# Patient Record
Sex: Male | Born: 1975 | Race: White | Hispanic: No | Marital: Married | Smoking: Never smoker
Health system: Southern US, Community
[De-identification: ages and names within clinical notes are randomized; demographics above are authoritative.]

## PROBLEM LIST (undated history)

## (undated) DIAGNOSIS — I10 Essential (primary) hypertension: Secondary | ICD-10-CM

## (undated) DIAGNOSIS — N400 Enlarged prostate without lower urinary tract symptoms: Secondary | ICD-10-CM

## (undated) DIAGNOSIS — R079 Chest pain, unspecified: Secondary | ICD-10-CM

## (undated) DIAGNOSIS — R000 Tachycardia, unspecified: Secondary | ICD-10-CM

## (undated) DIAGNOSIS — N419 Inflammatory disease of prostate, unspecified: Secondary | ICD-10-CM

## (undated) DIAGNOSIS — I4891 Unspecified atrial fibrillation: Secondary | ICD-10-CM

## (undated) DIAGNOSIS — I341 Nonrheumatic mitral (valve) prolapse: Secondary | ICD-10-CM

## (undated) HISTORY — DX: Chest pain, unspecified: R07.9

## (undated) HISTORY — DX: Tachycardia, unspecified: R00.0

## (undated) HISTORY — DX: Nonrheumatic mitral (valve) prolapse: I34.1

## (undated) HISTORY — DX: Inflammatory disease of prostate, unspecified: N41.9

## (undated) HISTORY — DX: Essential (primary) hypertension: I10

## (undated) HISTORY — PX: KNEE SURGERY: SHX244

---

## 1998-12-05 ENCOUNTER — Ambulatory Visit (HOSPITAL_BASED_OUTPATIENT_CLINIC_OR_DEPARTMENT_OTHER): Admission: RE | Admit: 1998-12-05 | Discharge: 1998-12-05 | Payer: Self-pay | Admitting: Orthopedic Surgery

## 2003-12-16 ENCOUNTER — Other Ambulatory Visit: Payer: Self-pay

## 2004-05-20 ENCOUNTER — Emergency Department: Payer: Self-pay | Admitting: Emergency Medicine

## 2008-03-29 ENCOUNTER — Emergency Department: Payer: Self-pay | Admitting: Emergency Medicine

## 2008-03-29 ENCOUNTER — Other Ambulatory Visit: Payer: Self-pay

## 2008-05-11 ENCOUNTER — Emergency Department: Payer: Self-pay | Admitting: Emergency Medicine

## 2008-05-13 ENCOUNTER — Emergency Department: Payer: Self-pay | Admitting: Internal Medicine

## 2012-03-16 ENCOUNTER — Emergency Department (INDEPENDENT_AMBULATORY_CARE_PROVIDER_SITE_OTHER): Payer: BC Managed Care – PPO

## 2012-03-16 ENCOUNTER — Encounter (HOSPITAL_COMMUNITY): Payer: Self-pay | Admitting: *Deleted

## 2012-03-16 ENCOUNTER — Emergency Department (INDEPENDENT_AMBULATORY_CARE_PROVIDER_SITE_OTHER)
Admission: EM | Admit: 2012-03-16 | Discharge: 2012-03-16 | Disposition: A | Discharge status: Home / Self Care | Payer: BC Managed Care – PPO | Source: Home / Self Care | Attending: Emergency Medicine | Admitting: Emergency Medicine

## 2012-03-16 DIAGNOSIS — R0789 Other chest pain: Secondary | ICD-10-CM

## 2012-03-16 DIAGNOSIS — R05 Cough: Secondary | ICD-10-CM

## 2012-03-16 MED ORDER — PREDNISONE 10 MG PO TABS: 20.0000 mg | ORAL_TABLET | Freq: Every day | ORAL | Status: DC

## 2012-03-16 MED ORDER — ACETAMINOPHEN-CODEINE #3 300-30 MG PO TABS: 1.0000 | ORAL_TABLET | Freq: Four times a day (QID) | ORAL | Status: DC | PRN

## 2012-03-16 NOTE — ED Notes (Signed)
Pt  Has  Symptoms  Of a  Cough  /  Congestion  With     sorethroat  And     A pretty  Much  Non [productive  Cough  Which  He reports  She  Has  Had  For  Several  Days  -     He  Is  Sitting  Upright  On the  Exam table  pseaking in  Complete  sentances  Is in no  Acute  Distress

## 2012-03-16 NOTE — ED Provider Notes (Signed)
History     CSN: [id number]  Arrival date & time 03/16/12  7829   First MD Initiated Contact with Patient 03/16/12 1008      Chief Complaint  Patient presents with  . URI    (Consider location/radiation/quality/duration/timing/severity/associated sxs/prior treatment) HPI Comments: Patient presents urgent care complaining of a cough, congestion and a sore throat with a dry cough. For about 3-5 days. Patient describes also that has this pressure sensation on the right side of his chest. And also some discomfort on the top of his left shoulder that is exacerbated by movement. Patient denies any fevers, shortness of breath or wheezing. Does describe that his wife has bronchitis and she was concerned that maybe he is having bronchitis as well. Patient is a nonsmoker and denies any other medical problems. Further questioning patient denies any constitutional symptoms such as fevers, mild as arthralgias headaches or changes in appetite.  Patient is a 36 y.o. male presenting with URI. The history is provided by the patient and the spouse.  URI The primary symptoms include cough. Primary symptoms do not include fever, fatigue, headaches, ear pain, wheezing, abdominal pain, nausea, vomiting, myalgias, arthralgias or rash. The current episode started 3 to 5 days ago. This is a new problem. The problem has not changed since onset. Symptoms associated with the illness include congestion and rhinorrhea. The illness is not associated with chills, plugged ear sensation or facial pain.    History reviewed. No pertinent past medical history.  History reviewed. No pertinent past surgical history.  History reviewed. No pertinent family history.  History  Substance Use Topics  . Smoking status: Not on file  . Smokeless tobacco: Not on file  . Alcohol Use: Not on file      Review of Systems  Constitutional: Negative for fever, chills, activity change and fatigue.  HENT: Positive for congestion and  rhinorrhea. Negative for ear pain, neck pain and neck stiffness.   Respiratory: Positive for cough. Negative for chest tightness, shortness of breath, wheezing and stridor.   Gastrointestinal: Negative for nausea, vomiting and abdominal pain.  Genitourinary: Negative for dysuria.  Musculoskeletal: Negative for myalgias and arthralgias.  Skin: Negative for color change and rash.  Neurological: Negative for dizziness, numbness and headaches.    Allergies  Review of patient's allergies indicates no known allergies.  Home Medications   Current Outpatient Rx  Name Route Sig Dispense Refill  . ACETAMINOPHEN-CODEINE #3 300-30 MG PO TABS Oral Take 1-2 tablets by mouth every 6 (six) hours as needed for pain. 15 tablet 0  . PREDNISONE 10 MG PO TABS Oral Take 2 tablets (20 mg total) by mouth daily. 15 tablet 0    BP 141/85  Pulse 60  Temp 98.5 F (36.9 C) (Oral)  Resp 18  SpO2 100%  Physical Exam  Nursing note and vitals reviewed. Constitutional: He appears well-nourished. No distress.  HENT:  Head: Normocephalic and atraumatic.  Eyes: Conjunctivae normal are normal.  Neck: Neck supple. No JVD present.  Cardiovascular: Normal rate and regular rhythm.  Exam reveals no gallop and no friction rub.   No murmur heard. Pulmonary/Chest: Effort normal and breath sounds normal. No respiratory distress. He has no decreased breath sounds. He has no wheezes. He has no rales. Chest wall is not dull to percussion. He exhibits tenderness. He exhibits no mass, no bony tenderness, no crepitus, no edema, no deformity, no swelling and no retraction.      ED Course  Procedures (including critical care time)  Labs Reviewed - No data to display Dg Chest 2 View  03/16/2012  *RADIOLOGY REPORT*  Clinical Data: Cough, congestion, fever.  CHEST - 2 VIEW  Comparison: None  Findings: Heart and mediastinal contours are within normal limits. No focal opacities or effusions.  No acute bony abnormality.   IMPRESSION: No active cardiopulmonary disease.   Original Report Authenticated By: Cyndie Chime, M.D.      1. Cough   2. Muscular chest pain      Handheld rhythm strip for 60 seconds with no observable abnormalities MDM  Patient with respiratory symptoms and anterior chest wall pain for several days. Patient was prescribed a symptomatic management course with prednisone and Tylenol No. 3 to help him with his muscular skeletal chest wall pain and is cough. Patient is afebrile no shortness of breath and uncertain if this is a infectious process at all. Both x-rays, EKG and exam were not suggestive of any cardiac origin or further lung disorder such as pneumonia or bronchitis or pneumothorax       Jimmie Molly, MD 03/16/12 1336

## 2012-08-28 ENCOUNTER — Encounter: Payer: Self-pay | Admitting: Family Medicine

## 2012-08-28 ENCOUNTER — Ambulatory Visit (INDEPENDENT_AMBULATORY_CARE_PROVIDER_SITE_OTHER): Payer: BC Managed Care – PPO | Admitting: Family Medicine

## 2012-08-28 VITALS — BP 138/92 | HR 64 | Temp 98.2°F | Ht 71.0 in | Wt 202.0 lb

## 2012-08-28 DIAGNOSIS — R413 Other amnesia: Secondary | ICD-10-CM

## 2012-08-28 DIAGNOSIS — Z136 Encounter for screening for cardiovascular disorders: Secondary | ICD-10-CM

## 2012-08-28 DIAGNOSIS — Z0000 Encounter for general adult medical examination without abnormal findings: Secondary | ICD-10-CM

## 2012-08-28 LAB — COMPREHENSIVE METABOLIC PANEL
AST: 34 U/L (ref 0–37)
Albumin: 4 g/dL (ref 3.5–5.2)
Alkaline Phosphatase: 72 U/L (ref 39–117)
Calcium: 9.3 mg/dL (ref 8.4–10.5)
Chloride: 104 mEq/L (ref 96–112)
Potassium: 3.6 mEq/L (ref 3.5–5.1)
Sodium: 138 mEq/L (ref 135–145)
Total Protein: 7.4 g/dL (ref 6.0–8.3)

## 2012-08-28 LAB — LIPID PANEL
LDL Cholesterol: 73 mg/dL (ref 0–99)
Total CHOL/HDL Ratio: 4

## 2012-08-28 NOTE — Progress Notes (Signed)
Subjective:    Patient ID: Chad Harrington, male    DOB: 08/02/1975, 37 y.o.   MRN: [id number]  HPI  Very pleasant 37 yo male here to establish care with his wife Chad Harrington, who is my patient.  Has not been to see a physician in over 10 or 12 years.  Does have strong FH of DM, COPD.  He is a non smoker.  His wife is concerned that at times he has tingling in his feet. He also seems to forgot little things she asks him to do. Never forgets names of people he knows or where is he is going.  Patient Active Problem List  Diagnosis  . Routine general medical examination at a health care facility   Past Medical History  Diagnosis Date  . Mitral valve prolapse    Past Surgical History  Procedure Laterality Date  . Knee surgery     History  Substance Use Topics  . Smoking status: Passive Smoke Exposure - Never Smoker  . Smokeless tobacco: Not on file  . Alcohol Use: Not on file   Family History  Problem Relation Age of Onset  . Diabetes Father   . COPD Father   . Hypertension Father   . Heart disease Father 57    MI  . Cancer Maternal Grandfather     colon CA   No Known Allergies No current outpatient prescriptions on file prior to visit.   No current facility-administered medications on file prior to visit.   The PMH, PSH, Social History, Family History, Medications, and allergies have been reviewed in [facility], and have been updated if relevant.    Review of Systems See HPI Patient reports no  vision/ hearing changes,anorexia, weight change, fever ,adenopathy, persistant / recurrent hoarseness, swallowing issues, chest pain, edema,persistant / recurrent cough, hemoptysis, dyspnea(rest, exertional, paroxysmal nocturnal), gastrointestinal  bleeding (melena, rectal bleeding), abdominal pain, excessive heart burn, GU symptoms(dysuria, hematuria, pyuria, voiding/incontinence  Issues) syncope, focal weakness, severe memory loss, concerning skin lesions, depression, anxiety,  abnormal bruising/bleeding, major joint swelling.       Objective:   Physical Exam BP 138/92  Pulse 64  Temp(Src) 98.2 F (36.8 C)  Ht 5\' 11"  (1.803 m)  Wt 202 lb (91.627 kg)  BMI 28.19 kg/m2 General:  overweght male in NAD Eyes:  PERRL Ears:  External ear exam shows no significant lesions or deformities.  Otoscopic examination reveals clear canals, tympanic membranes are intact bilaterally without bulging, retraction, inflammation or discharge. Hearing is grossly normal bilaterally. Nose:  External nasal examination shows no deformity or inflammation. Nasal mucosa are pink and moist without lesions or exudates. Mouth:  Oral mucosa and oropharynx without lesions or exudates.  Teeth in good repair. Neck:  no carotid bruit or thyromegaly no cervical or supraclavicular lymphadenopathy  Lungs:  Normal respiratory effort, chest expands symmetrically. Lungs are clear to auscultation, no crackles or wheezes. Heart:  Normal rate and regular rhythm. S1 and S2 normal without gallop, murmur, click, rub or other extra sounds. Abdomen:  Bowel sounds positive,abdomen soft and non-tender without masses, organomegaly or hernias noted. Pulses:  R and L posterior tibial pulses are full and equal bilaterally  Extremities:  no edema      Assessment & Plan:  1. Routine general medical examination at a health care facility .preventinon  - Comprehensive metabolic panel  2. Screening for ischemic heart disease  - Lipid Panel  3. Memory loss No signs on exam for something worrisome- non focal neuro exam.  Will check CMET, TSH and B12 to rule out other possible factors.  The patient indicates understanding of these issues and agrees with the plan.  - Vitamin B12 - TSH

## 2012-08-28 NOTE — Patient Instructions (Addendum)
Good to see you. We will call you with your lab results.   

## 2012-08-29 ENCOUNTER — Encounter: Payer: Self-pay | Admitting: *Deleted

## 2012-08-29 LAB — TSH: TSH: 1.23 u[IU]/mL (ref 0.35–5.50)

## 2012-08-29 LAB — VITAMIN B12: Vitamin B-12: 219 pg/mL (ref 211–911)

## 2013-02-24 ENCOUNTER — Ambulatory Visit (INDEPENDENT_AMBULATORY_CARE_PROVIDER_SITE_OTHER): Payer: BC Managed Care – PPO | Admitting: Internal Medicine

## 2013-02-24 ENCOUNTER — Encounter: Payer: Self-pay | Admitting: Internal Medicine

## 2013-02-24 VITALS — BP 124/84 | HR 74 | Temp 98.4°F | Wt 197.0 lb

## 2013-02-24 DIAGNOSIS — Z8679 Personal history of other diseases of the circulatory system: Secondary | ICD-10-CM

## 2013-02-24 DIAGNOSIS — R209 Unspecified disturbances of skin sensation: Secondary | ICD-10-CM

## 2013-02-24 DIAGNOSIS — R202 Paresthesia of skin: Secondary | ICD-10-CM | POA: Insufficient documentation

## 2013-02-24 DIAGNOSIS — R0789 Other chest pain: Secondary | ICD-10-CM

## 2013-02-24 DIAGNOSIS — R200 Anesthesia of skin: Secondary | ICD-10-CM

## 2013-02-24 NOTE — Progress Notes (Signed)
Chief Complaint  Patient presents with  . right hand numbenss and tingling    chest discomfort     HPI: Patient comes in today for [facility] Saturday clinic for  new problem evaluation. 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 From shoulder . No injury went to work.  But developed what he called  Chest tightness without sob sweats or syncope of dizziness of fever.  He describe the sx like"Funny bone type sx"  And went to work and when arm felt  heavy and numb. He contacted his wife . Came here to saturday clinic cause of concerns   He has a hx of heart issues.   MVP  No cad .   Right  Arm and right handed.no injury active . No hx of same . uncertain if hand looks swollen   ROS: See pertinent positives and negatives per HPI. No cough sob sweating syncope NVD no bleeding hx  No leg sx no dysphagia   Past Medical History  Diagnosis Date  . Mitral valve prolapse     Family History  Problem Relation Age of Onset  . Diabetes Father   . COPD Father   . Hypertension Father   . Heart disease Father 8    MI  . Cancer Maternal Grandfather     colon CA    History   Social History  . Marital Status: Married    Spouse Name: N/A    Number of Children: N/A  . Years of Education: N/A   Social History Main Topics  . Smoking status: Passive Smoke Exposure - Never Smoker  . Smokeless tobacco: None  . Alcohol Use: None  . Drug Use: None  . Sexual Activity: None   Other Topics Concern  . None   Social History Narrative  . None    No outpatient encounter prescriptions on file as of 02/24/2013.   No facility-administered encounter medications on file as of 02/24/2013.    EXAM:  BP 124/84  Pulse 74  Temp(Src) 98.4 F (36.9 C) (Oral)  Wt 197 lb (89.359 kg)  BMI 27.49 kg/m2  Body mass index is 27.49 kg/(m^2).  GENERAL: vitals reviewed and listed above, alert,  oriented, appears well hydrated and in no acute distress HEENT: atraumatic, conjunctiva  clear, no obvious abnormalities on inspection of external nose and ears OP : no lesion edema or exudate   NECK: no obvious masses on inspection palpation  No adenopathy or jvd no bruit  LUNGS: clear to auscultation bilaterally, no wheezes, rales or rhonchi, good air movement CV: HRRR, no m heart today  no clubbing cyanosis or  peripheral edema nl cap refill  MS: moves all extremities right hand looks puffy no redness or warmth veins collapse at heart level and pulsed radial nl ? Ulnar. Nails beds normal .  NEURO: oriented x 3 CN 3-12 appear intact. No focal muscle weakness or atrophy. DTRs symmetrical. Gait WNL.  Grossly non focal. No tremor or abnormal movement. Grip nl ? Ulnar finger opposition may be less on right than left   No atrophy.  IOM Skin clear tatoos PSYCH: pleasant and cooperative, no obvious depression or anxiety EKG no acute changes ASSESSMENT AND PLAN:  Discussed the following assessment and plan:  Numbness and tingling of right arm - nl exam except puffy hand ? compression sx ulnar distributionvs other  Chest discomfort - no acute findings on ekg and no assc sx   - Plan: EKG 12-Lead  History of mitral valve prolapse Stable at this time  No work this week end observe and if recurring or alarm features seek ed care   Otherwise to PCP office after weekend -Patient advised to return or notify health care team  if symptoms worsen or persist or new concerns arise.  Patient Instructions   This doesn't seem like acute heart or vascular event at this time but I agree needs follow up with your doctor. The arm sx can certainly be related to pinched nerves either in neck or down arm based   Your.  hx and exam.   Fortunately  Heart sounds good and no signs of stroke .  No work  For this weekend .   See  Your doctor  on Monday .  If  worsening sx  Or any weakness or other difficulties then  contact on call service  Or other  Urgent care.     Neta Mends. Panosh M.D.

## 2013-02-24 NOTE — Patient Instructions (Signed)
   This doesn't seem like acute heart or vascular event at this time but I agree needs follow up with your doctor. The arm sx can certainly be related to pinched nerves either in neck or down arm based   Your.  hx and exam.   Fortunately  Heart sounds good and no signs of stroke .  No work  For this weekend .   See  Your doctor  on Monday .  If  worsening sx  Or any weakness or other difficulties then contact on call service  Or other  Urgent care.

## 2013-02-26 ENCOUNTER — Encounter: Payer: Self-pay | Admitting: Cardiovascular Disease

## 2013-02-26 ENCOUNTER — Ambulatory Visit (INDEPENDENT_AMBULATORY_CARE_PROVIDER_SITE_OTHER): Payer: BC Managed Care – PPO | Admitting: Cardiovascular Disease

## 2013-02-26 ENCOUNTER — Encounter: Payer: Self-pay | Admitting: Family Medicine

## 2013-02-26 ENCOUNTER — Ambulatory Visit (INDEPENDENT_AMBULATORY_CARE_PROVIDER_SITE_OTHER): Payer: BC Managed Care – PPO | Admitting: Family Medicine

## 2013-02-26 ENCOUNTER — Telehealth: Payer: Self-pay | Admitting: Family Medicine

## 2013-02-26 VITALS — BP 120/90 | HR 53 | Ht 71.0 in | Wt 197.8 lb

## 2013-02-26 VITALS — BP 120/82 | HR 62 | Temp 97.9°F | Wt 198.0 lb

## 2013-02-26 DIAGNOSIS — R200 Anesthesia of skin: Secondary | ICD-10-CM

## 2013-02-26 DIAGNOSIS — R0789 Other chest pain: Secondary | ICD-10-CM

## 2013-02-26 DIAGNOSIS — Z8679 Personal history of other diseases of the circulatory system: Secondary | ICD-10-CM

## 2013-02-26 DIAGNOSIS — R209 Unspecified disturbances of skin sensation: Secondary | ICD-10-CM

## 2013-02-26 DIAGNOSIS — R002 Palpitations: Secondary | ICD-10-CM

## 2013-02-26 DIAGNOSIS — R079 Chest pain, unspecified: Secondary | ICD-10-CM

## 2013-02-26 MED ORDER — OMEPRAZOLE 20 MG PO CPDR: 20.0000 mg | DELAYED_RELEASE_CAPSULE | Freq: Two times a day (BID) | ORAL | Status: DC

## 2013-02-26 MED ORDER — NITROGLYCERIN 0.4 MG SL SUBL: 0.4000 mg | SUBLINGUAL_TABLET | SUBLINGUAL | Status: DC | PRN

## 2013-02-26 NOTE — Progress Notes (Signed)
   HPI: Very pleasant 37 yo male who takes no medications regularly here for: Persistent chest discomfort.  Went to Saturday clinic this weekend for right arm tingling, swelling and chest discomfort.  EKG borderline, advised to follow up with me today.  Right arm tingling has improved and swelling is gone but continues to have chest discomfort.  Wife reports that he woke up 4 or 5 times last night with chest tightness.  He has a hx of heart issues.   MVP  No cad . Chest pain can occur with rest or exertion.  Has had some dizziness and diaphoresis with exertion which is improved with rest.  Dad had MI in his 40s.  Lab Results  Component Value Date   CHOL 138 08/28/2012   HDL 33.40* 08/28/2012   LDLCALC 73 08/28/2012   TRIG 159.0* 08/28/2012   CHOLHDL 4 08/28/2012      Past Medical History  Diagnosis Date  . Mitral valve prolapse     Family History  Problem Relation Age of Onset  . Diabetes Father   . COPD Father   . Hypertension Father   . Heart disease Father 67    MI  . Cancer Maternal Grandfather     colon CA    History   Social History  . Marital Status: Married    Spouse Name: N/A    Number of Children: N/A  . Years of Education: N/A   Social History Main Topics  . Smoking status: Passive Smoke Exposure - Never Smoker  . Smokeless tobacco: None  . Alcohol Use: None  . Drug Use: None  . Sexual Activity: None   Other Topics Concern  . None   Social History Narrative  . None    No outpatient encounter prescriptions on file as of 02/26/2013.   No facility-administered encounter medications on file as of 02/26/2013.    EXAM:  BP 120/82  Pulse 62  Temp(Src) 97.9 F (36.6 C)  Wt 198 lb (89.812 kg)  BMI 27.63 kg/m2  SpO2 98%  Body mass index is 27.63 kg/(m^2).  GENERAL: vitals reviewed and listed above, alert, oriented, appears well hydrated and in no acute distress HEENT: atraumatic, conjunctiva  clear, no obvious abnormalities on inspection of  external nose and ears OP : no lesion edema or exudate  NECK: no obvious masses on inspection palpation  No adenopathy or jvd no bruit  LUNGS: clear to auscultation bilaterally, no wheezes, rales or rhonchi, good air movement CV: HRRR, no m heart today  no clubbing cyanosis or  peripheral edema nl cap refill  MS: moves all extremities right hand looks puffy no redness or warmth veins collapse at heart level and pulsed radial nl ? Ulnar. Nails beds normal .  NEURO: oriented x 3 CN 3-12 appear intact. No focal muscle weakness or atrophy. DTRs symmetrical. Gait WNL.  Grossly non focal. No tremor or abnormal PSYCH: pleasant and cooperative, no obvious depression or anxiety   ASSESSMENT AND PLAN:  1. Chest discomfort Persistent, somewhat atypical but is having some exertional symptoms relieved by rest.  Does not evaluation by cardiologist- echo and stress test test.  Referral placed. EKG unchanged from Saturday.  Does have some bradycardia. The patient indicates understanding of these issues and agrees with the plan.  - Ambulatory referral to Cardiology - EKG 12-Lead

## 2013-02-26 NOTE — Progress Notes (Signed)
Patient ID: Chad Harrington, male    DOB: Nov 01, 1975, 37 y.o.   MRN: [id number]  HPI Comments: Mr. Heft is a very pleasant 37 year old gentleman history of chronic chest pain dating back to age 33 who presents for evaluation of chest pain symptoms.  He reports that he has had numerous trips to the emergency room, workup in the past. He has been treated with various medications including atenolol, Toprol with no improvement of his symptoms.  visits to the emergency room did not yield a reason for his chest pain. Felt to have GERD. Never tried proton pump inhibitors. Told to try H2 blockers such as Zantac. He has tried NSAIDs before with no relief of his symptoms.   He reports having symptoms approximately once a month but sometimes it will go away for a period of time before recurring. It seems to wax and wane, predominately at nighttime. No significant symptoms with exertion. His job is to work on cars, Research officer, political party. No reproducible pain with lifting heavy items.  Today he reports having mild chest pain, left of mediastinum, lower area of his pectoral region  Reports that he was diagnosed with mitral valve prolapse at age 37.   Also had episodes of sweating, tachycardia, weakness and dizziness at the end of 2013. No recent episodes   States that he has never had a stress test before  In terms of his family history, brother had CAD in his late 71s. He is a smoker father also has history of CAD, stroke, CHF in his 76s also a smoker   EKG shows normal sinus rhythm with rate 53 beats per minute, no significant ST or T wave changes    Outpatient Encounter Prescriptions as of 02/26/2013  Medication Sig Dispense Refill  . nitroGLYCERIN (NITROSTAT) 0.4 MG SL tablet Place 1 tablet (0.4 mg total) under the tongue every 5 (five) minutes as needed.  25 tablet  3  . omeprazole (PRILOSEC) 20 MG capsule Take 1 capsule (20 mg total) by mouth 2 (two) times daily.  60 capsule  11   No  facility-administered encounter medications on file as of 02/26/2013.     Review of Systems  Constitutional: Negative.   HENT: Negative.   Eyes: Negative.   Respiratory: Positive for chest tightness.   Cardiovascular: Positive for chest pain.  Gastrointestinal: Negative.   Musculoskeletal: Negative.   Skin: Negative.   Neurological: Negative.   Psychiatric/Behavioral: Negative.   All other systems reviewed and are negative.    BP 120/90  Pulse 53  Ht 5\' 11"  (1.803 m)  Wt 197 lb 12 oz (89.699 kg)  BMI 27.59 kg/m2  Physical Exam  Nursing note and vitals reviewed. Constitutional: He is oriented to person, place, and time. He appears well-developed and well-nourished.  HENT:  Head: Normocephalic.  Nose: Nose normal.  Mouth/Throat: Oropharynx is clear and moist.  Eyes: Conjunctivae are normal. Pupils are equal, round, and reactive to light.  Neck: Normal range of motion. Neck supple. No JVD present.  Cardiovascular: Normal rate, regular rhythm, S1 normal, S2 normal, normal heart sounds and intact distal pulses.  Exam reveals no gallop and no friction rub.   No murmur heard. Pulmonary/Chest: Effort normal and breath sounds normal. No respiratory distress. He has no wheezes. He has no rales. He exhibits no tenderness.  Abdominal: Soft. Bowel sounds are normal. He exhibits no distension. There is no tenderness.  Musculoskeletal: Normal range of motion. He exhibits no edema and no tenderness.  Lymphadenopathy:  He has no cervical adenopathy.  Neurological: He is alert and oriented to person, place, and time. Coordination normal.  Skin: Skin is warm and dry. No rash noted. No erythema.  Psychiatric: He has a normal mood and affect. His behavior is normal. Judgment and thought content normal.      Assessment and Plan

## 2013-02-26 NOTE — Telephone Encounter (Signed)
Spoke with patient's wife, they are coming in.

## 2013-02-26 NOTE — Telephone Encounter (Signed)
Ok to put him on schedule, please have him come in now.

## 2013-02-26 NOTE — Assessment & Plan Note (Addendum)
Atypical chest pain. No association with exertion, often coming on at nighttime. Unable to rule out anxiety/panic attack, musculoskeletal etiology, GERD or GI issues such as hiatal hernia. He reports that NSAIDs do not help. No rhyme or reason to his symptoms. We have offered routine treadmill test to rule out ischemia. He could try nitroglycerin for spasm. Also recommended he try at least one month of omeprazole to GI etiology.  Other option if everything is negative would be a chest CT scan while he has ongoing chest pain.

## 2013-02-26 NOTE — Telephone Encounter (Signed)
Patient Information:  Caller Name: Clydie Braun  Phone: [phone]  Patient: Chad Harrington, Chad Harrington  Gender: Male  DOB: [birth year]  Age: 37 Years  PCP: Ruthe Mannan [facility])  Office Follow Up:  Does the office need to follow up with this patient?: Yes  Instructions For The Office: Patient needs to be seen today due to symptoms. PLEASE CONTACT.  RN Note:  Concern with low pulse and episodes of chest tightness and irregular Heartrate last pm.  Recommend to be seen today. PLEASE CONTACT PATIENT  [phone]  Symptoms  Reason For Call & Symptoms: Wife is calling about her husband. He was seen in the clinic on Saturday 02/25/15 with numbness in right and chest tightness. EKG was done and was "boarderline" per wife. Told to go home rest and see PCP on Monday.  She states today , arm is still numb . Described as iintermittent and  hand looks slightly swollen.  No chest pain , no shortness of breath, no weakness. They have appt tomorrow 02/27/13.  Today his  B/p 137/93 with a pulse  P48. Marland Kitchen He did feel his  heart beating irregularly last night and can feel pulse in arms and legs .  Reviewed Health History In EMR: Yes  Reviewed Medications In EMR: Yes  Reviewed Allergies In EMR: Yes  Reviewed Surgeries / Procedures: Yes  Date of Onset of Symptoms: 02/24/2013  Guideline(s) Used:  Chest Pain  Disposition Per Guideline:   See Today in Office  Reason For Disposition Reached:   Patient wants to be seen  Advice Given:  Call Back If:  Severe chest pain  Constant chest pain lasting longer than 5 minutes  Difficulty breathing  You become worse.  RN Overrode Recommendation:  Make Appointment  Patient needs to be seen today. Scheduled 02/27/13  at 12:15

## 2013-02-26 NOTE — Patient Instructions (Addendum)
Good to see you. Please stop by to see Shirlee Limerick on your way out to set up your cardiology appointment.

## 2013-02-26 NOTE — Assessment & Plan Note (Signed)
Numbness of the right arm likely unrelated to any cardiac issue. Possible DJD with nerve impingement or carpal tunnel

## 2013-02-26 NOTE — Assessment & Plan Note (Signed)
No murmur appreciated on exam. Likely not a major issue. No echocardiogram ordered at this time.

## 2013-02-26 NOTE — Patient Instructions (Addendum)
  Please do a trial of omeprazole one a day for chest pain Please try nitro as needed for chest pain  We will schedule you for a treadmill stress test  Please call us if you have new issues that need to be addressed before your next appt.

## 2013-02-27 ENCOUNTER — Ambulatory Visit: Payer: BC Managed Care – PPO | Admitting: Family Medicine

## 2013-03-12 ENCOUNTER — Encounter: Payer: Self-pay | Admitting: Cardiovascular Disease

## 2013-03-12 ENCOUNTER — Ambulatory Visit (INDEPENDENT_AMBULATORY_CARE_PROVIDER_SITE_OTHER): Payer: BC Managed Care – PPO | Admitting: Cardiovascular Disease

## 2013-03-12 VITALS — BP 142/88 | HR 80 | Ht 71.0 in | Wt 201.8 lb

## 2013-03-12 DIAGNOSIS — R0789 Other chest pain: Secondary | ICD-10-CM

## 2013-03-12 MED ORDER — AMLODIPINE BESYLATE 10 MG PO TABS: 10.0000 mg | ORAL_TABLET | Freq: Every day | ORAL | Status: DC

## 2013-03-12 NOTE — Procedures (Signed)
Exercise Treadmill Test  Treadmill ordered for recent epsiodes of chest pain.  Resting EKG shows NSR with rate of 80 bpm, nonspecific T wave ABN Resting blood pressure of  14/88 Stand bruce protocal was used.  Patient exercised for 10 min 18 sec Peak heart rate of 165 bpm.  This was 105% of the maximum predicted heart rate (target heart rate 150). Achieved 12.9  METS No symptoms of chest pain or lightheadedness were reported at peak stress or in recovery.  Peak Blood pressure recorded was 200/100 Heart rate at 3 minutes in recovery was 110 bpm. No ST changes concerning for ischemia  FINAL IMPRESSION: Normal exercise stress test. No significant EKG changes concerning for ischemia. Excellent exercise tolerance.  Chest pain is likely noncardiac. No further testing needed at this time. Thank you for providing your blood pressure measurements today.  We will start you on amlodipine 5 mg daily. This dose could be increased to 10 mg daily if needed for elevated blood pressure.

## 2013-03-12 NOTE — Patient Instructions (Addendum)
Stress test is normal. No further workup needed.  Please start amlodipine 1/2 pill daily for high blood pressure Monitor your blood pressure Take a full pill if your blood pressure continues to run high  Please call us if you have new issues that need to be addressed before your next appt.

## 2013-04-21 DIAGNOSIS — N419 Inflammatory disease of prostate, unspecified: Secondary | ICD-10-CM

## 2013-04-21 HISTORY — DX: Inflammatory disease of prostate, unspecified: N41.9

## 2013-04-26 ENCOUNTER — Other Ambulatory Visit: Payer: Self-pay

## 2013-05-14 ENCOUNTER — Ambulatory Visit (INDEPENDENT_AMBULATORY_CARE_PROVIDER_SITE_OTHER): Payer: BC Managed Care – PPO | Admitting: Family Medicine

## 2013-05-14 ENCOUNTER — Encounter: Payer: Self-pay | Admitting: Family Medicine

## 2013-05-14 VITALS — BP 124/82 | HR 80 | Temp 98.5°F | Wt 199.2 lb

## 2013-05-14 DIAGNOSIS — R3589 Other polyuria: Secondary | ICD-10-CM

## 2013-05-14 DIAGNOSIS — R358 Other polyuria: Secondary | ICD-10-CM | POA: Insufficient documentation

## 2013-05-14 LAB — POCT URINALYSIS DIPSTICK
Blood, UA: NEGATIVE
Glucose, UA: NEGATIVE
Leukocytes, UA: NEGATIVE
Nitrite, UA: NEGATIVE
Urobilinogen, UA: NEGATIVE

## 2013-05-14 LAB — BASIC METABOLIC PANEL
Calcium: 9.5 mg/dL (ref 8.4–10.5)
GFR: 80.9 mL/min (ref >60.00)
Sodium: 137 mEq/L (ref 135–145)

## 2013-05-14 NOTE — Patient Instructions (Signed)
Go to the lab on the way out.  We'll contact you with your lab report. Give me an update in a few days.   Take care.  Glad to see you.

## 2013-05-14 NOTE — Progress Notes (Signed)
Pre-visit discussion using our clinic review tool. No additional management support is needed unless otherwise documented below in the visit note.  Inc in urination (goingon for ~6 months).  Pain in R testicle (started about 1 month ago).  Doesn't feel a lump.  Intermittent pain, irregular onset.  Dec in erections, doesn't last (started ~2-3 weeks ago).  No change when he stopped amlodipine.  BP 156/104 at home yesterday.  Still off BP med in meantime.    No burning with urination.  Inc in thirst noted.  No FCNAVD, no rash. He feels tired.  Sleep pattern has altered, sometimes from nocturia.  No h/o STD.  Monogamous.    No recent chest pain.  Prev ETT wnl.    Meds, vitals, and allergies reviewed.   ROS: See HPI.  Otherwise, noncontributory.  nad rrr ctab abd soft, not ttp Testes bilaterally descended without nodularity, tenderness or masses. No scrotal masses or lesions. No penis lesions or urethral discharge. No hernias.

## 2013-05-14 NOTE — Assessment & Plan Note (Addendum)
With ED and fatigue.  Would check for DM2 today.  No clear source o/w for his sx.  Normal exam.  No abnormality on testicle exam. If testicle pain persists we can set up an u/s but the exam was unremarkable.  I would check the other labs first.  He agrees.   Unclear if ED could have been related to BP med.  Off for now, continue off as BP is okay.  If BP elevated, then he'll restart amlodipine.

## 2013-05-16 ENCOUNTER — Other Ambulatory Visit: Payer: Self-pay | Admitting: Family Medicine

## 2013-05-16 DIAGNOSIS — N50819 Testicular pain, unspecified: Secondary | ICD-10-CM

## 2013-05-22 ENCOUNTER — Other Ambulatory Visit: Payer: Self-pay | Admitting: Family Medicine

## 2013-05-22 DIAGNOSIS — N50819 Testicular pain, unspecified: Secondary | ICD-10-CM

## 2013-05-28 ENCOUNTER — Other Ambulatory Visit: Payer: Self-pay | Admitting: Family Medicine

## 2013-05-28 ENCOUNTER — Other Ambulatory Visit: Payer: BC Managed Care – PPO

## 2013-05-28 ENCOUNTER — Ambulatory Visit
Admission: RE | Admit: 2013-05-28 | Discharge: 2013-05-28 | Disposition: A | Discharge status: Home / Self Care | Payer: BC Managed Care – PPO | Source: Ambulatory Visit | Attending: Family Medicine | Admitting: Family Medicine

## 2013-05-28 DIAGNOSIS — N5082 Scrotal pain: Secondary | ICD-10-CM

## 2013-05-28 DIAGNOSIS — N50819 Testicular pain, unspecified: Secondary | ICD-10-CM

## 2014-02-21 ENCOUNTER — Encounter: Payer: Self-pay | Admitting: Family Medicine

## 2014-02-21 ENCOUNTER — Ambulatory Visit (INDEPENDENT_AMBULATORY_CARE_PROVIDER_SITE_OTHER)
Admission: RE | Admit: 2014-02-21 | Discharge: 2014-02-21 | Disposition: A | Discharge status: Home / Self Care | Payer: BC Managed Care – PPO | Source: Ambulatory Visit | Attending: Family Medicine | Admitting: Family Medicine

## 2014-02-21 ENCOUNTER — Ambulatory Visit (INDEPENDENT_AMBULATORY_CARE_PROVIDER_SITE_OTHER): Payer: BC Managed Care – PPO | Admitting: Family Medicine

## 2014-02-21 VITALS — BP 124/84 | HR 69 | Temp 98.2°F | Ht 71.0 in | Wt 213.5 lb

## 2014-02-21 DIAGNOSIS — M25562 Pain in left knee: Secondary | ICD-10-CM

## 2014-02-21 DIAGNOSIS — M25569 Pain in unspecified knee: Secondary | ICD-10-CM

## 2014-02-21 DIAGNOSIS — M1710 Unilateral primary osteoarthritis, unspecified knee: Secondary | ICD-10-CM

## 2014-02-21 DIAGNOSIS — M1712 Unilateral primary osteoarthritis, left knee: Secondary | ICD-10-CM

## 2014-02-21 MED ORDER — DICLOFENAC SODIUM 75 MG PO TBEC: 75.0000 mg | DELAYED_RELEASE_TABLET | Freq: Two times a day (BID) | ORAL | Status: DC

## 2014-02-21 MED ORDER — METHYLPREDNISOLONE ACETATE 40 MG/ML IJ SUSP
80.0000 mg | Freq: Once | INTRAMUSCULAR | Status: AC
  Administered 2014-02-21: 80 mg via INTRA_ARTICULAR

## 2014-02-21 NOTE — Progress Notes (Signed)
Dr. Karleen Hampshire T. Tristain Daily, MD, CAQ Sports [facility] [address] [place], [address] Phone: [phone] Fax: [phone]  02/21/2014  Patient: Chad Harrington, MRN: [id number], DOB: 1976/03/06, 37 y.o.  Primary Physician:  Ruthe Mannan, MD  Chief Complaint: Knee Pain  Subjective:   This 38 y.o. male patient presents with 10 year + 3 month L sided knee pain after initial injury and surgery in 2000. No audible pop was heard. The patient has not had an effusion. No symptomatic giving-way. No mechanical clicking. Joint has not locked up. Patient has been able to walk but is limping. The patient does have pain going up and down stairs or rising from a seated position.   Left medial knee pain and feels like it is vibrating. Off and on for years. 25% of meniscus removed. 3 years ago.  3 months ago - really started to get worse.   Jodi Geralds - about 2000.   Pain location: anteromedial Current physical activity: active at work Prior Knee Surgery: partial menisectomy Current pain meds: Tylenol, Advil, Alleve Bracing: none Occupation or school level: [employer], [employer]  The PMH, PSH, Social History, Family History, Medications, and allergies have been reviewed in [facility], and have been updated if relevant.  ROS: no acute illness or fever MSK: above GI: tol po intake without nausea or vomitting. Neuro: no numbness, tingling, or radiculopathy O/w per hpi  Objective:   Blood pressure 124/84, pulse 69, temperature 98.2 F (36.8 C), temperature source Oral, height  (1.803 m), weight 213 lb 8 oz (96.843 kg).  GEN: Well-developed,well-nourished,in no acute distress; alert,appropriate and cooperative throughout examination HEENT: Normocephalic and atraumatic without obvious abnormalities. Ears, externally no deformities PULM: Breathing comfortably in no respiratory distress EXT: No clubbing, cyanosis, or edema PSYCH: Normally interactive.  Cooperative during the interview. Pleasant. Friendly and conversant. Not anxious or depressed appearing. Normal, full affect.  Gait: Normal heel toe pattern ROM: WNL Effusion: neg Echymosis or edema: none Patellar tendon NT Painful PLICA: neg Patellar grind: negative Medial and lateral patellar facet loading: negative medial and lateral joint lines: TTP medially Mcmurray's neg Flexion-pinch neg Varus and valgus stress: stable Lachman: neg Ant and Post drawer: neg Hip abduction, IR, ER: WNL Hip flexion str: 5/5 Hip abd: 5/5 Quad: 5/5 VMO atrophy:No Hamstring concentric and eccentric: 5/5  Radiology: Dg Knee Ap/lat W/sunrise Left  02/21/2014   CLINICAL DATA:  Three month history of left knee pain without known of trauma.  EXAM: DG KNEE - 3 VIEWS  COMPARISON:  None.  FINDINGS: The bones of the left knee are adequately mineralized. There is no acute fracture nor dislocation. There is no joint effusion. There is minimal beaking of the tibial spines.  IMPRESSION: There is no acute or significant chronic bony abnormality of the left knee. If patient's symptoms persist and remain unexplained, MRI would be a useful next step.   Electronically Signed   By: David  [place]   On: 02/21/2014 13:59    The radiological images were independently reviewed by myself in the office and results were reviewed with the patient. My independent interpretation of images:  My interpretation on AP view, weight-bearing, there is a mild degree of osteoarthritic change with some flattening of the femoral condyle medially as well as some slight decrease in joint space. Electronically Signed  By: Hannah Beat, MD On: 02/21/2014 2:06 PM   Assessment and Plan:   Osteoarthrosis, localized, primary, knee, left  Left knee  pain - Plan: DG Knee AP/LAT W/Sunrise Left, methylPREDNISolone acetate (DEPO-MEDROL) injection 80 mg  In my opinion, the patient has some mild medial compartmental osteoarthritis secondary to prior  partial meniscectomy. We are going to use some oral anti-inflammatories and inject today. F/u if no improvement.  Knee Injection, LEFT Patient verbally consented to procedure. Risks (including potential rare risk of infection), benefits, and alternatives explained. Sterilely prepped with Chloraprep. Ethyl cholride used for anesthesia. 8 cc Lidocaine 1% mixed with Depo-Medrol 80 mg injected using the anteromedial approach without difficulty. No complications with procedure and tolerated well. Patient had decreased pain post-injection.   Follow-up: Return if symptoms worsen or fail to improve.  New Prescriptions   DICLOFENAC (VOLTAREN) 75 MG EC TABLET    Take 1 tablet (75 mg total) by mouth 2 (two) times daily.   Orders Placed This Encounter  Procedures  . DG Knee AP/LAT W/Sunrise Left    Signed,  Zadrian Mccauley T. Carrie Usery, MD   Patient's Medications  New Prescriptions   DICLOFENAC (VOLTAREN) 75 MG EC TABLET    Take 1 tablet (75 mg total) by mouth 2 (two) times daily.  Previous Medications   AMLODIPINE (NORVASC) 10 MG TABLET    Take 1 tablet (10 mg total) by mouth daily.   NITROGLYCERIN (NITROSTAT) 0.4 MG SL TABLET    Place 1 tablet (0.4 mg total) under the tongue every 5 (five) minutes as needed.   OMEPRAZOLE (PRILOSEC) 20 MG CAPSULE    Take 1 capsule (20 mg total) by mouth 2 (two) times daily.  Modified Medications   No medications on file  Discontinued Medications   No medications on file

## 2014-02-21 NOTE — Progress Notes (Signed)
Pre visit review using our clinic review tool, if applicable. No additional management support is needed unless otherwise documented below in the visit note. 

## 2014-03-07 ENCOUNTER — Telehealth: Payer: Self-pay

## 2014-03-07 NOTE — Telephone Encounter (Signed)
I would first try something basic like prune or apricot juice along with generic docusate twice a day for now.   If it doesn't clear up in 7-10 days, we could change to something differernt.

## 2014-03-07 NOTE — Telephone Encounter (Signed)
Chad Harrington notified as instructed by telephone.

## 2014-03-07 NOTE — Telephone Encounter (Signed)
Mrs Haberland left v/m; pt seen 02/21/14 diclofenac is helping with knee pain but causing pt to be very constipated. Mrs Assefa wanted to know if could be prescribed different med that would not constipate pt or what to do.[facility] [address]

## 2014-03-20 ENCOUNTER — Other Ambulatory Visit: Payer: Self-pay | Admitting: Cardiovascular Disease

## 2014-03-29 ENCOUNTER — Other Ambulatory Visit: Payer: Self-pay | Admitting: Cardiovascular Disease

## 2014-03-31 ENCOUNTER — Other Ambulatory Visit: Payer: Self-pay | Admitting: Cardiovascular Disease

## 2014-04-05 ENCOUNTER — Other Ambulatory Visit: Payer: Self-pay

## 2014-04-29 ENCOUNTER — Encounter: Payer: Self-pay | Admitting: Cardiovascular Disease

## 2014-04-29 ENCOUNTER — Ambulatory Visit (INDEPENDENT_AMBULATORY_CARE_PROVIDER_SITE_OTHER): Payer: BC Managed Care – PPO | Admitting: Cardiovascular Disease

## 2014-04-29 VITALS — BP 142/98 | HR 68 | Ht 71.0 in | Wt 223.8 lb

## 2014-04-29 DIAGNOSIS — R0789 Other chest pain: Secondary | ICD-10-CM

## 2014-04-29 DIAGNOSIS — I10 Essential (primary) hypertension: Secondary | ICD-10-CM

## 2014-04-29 DIAGNOSIS — R0602 Shortness of breath: Secondary | ICD-10-CM

## 2014-04-29 DIAGNOSIS — R079 Chest pain, unspecified: Secondary | ICD-10-CM

## 2014-04-29 DIAGNOSIS — I158 Other secondary hypertension: Secondary | ICD-10-CM

## 2014-04-29 MED ORDER — DILTIAZEM HCL ER COATED BEADS 120 MG PO CP24: 120.0000 mg | ORAL_CAPSULE | Freq: Every day | ORAL | Status: DC

## 2014-04-29 MED ORDER — NITROGLYCERIN 0.4 MG SL SUBL: 0.4000 mg | SUBLINGUAL_TABLET | SUBLINGUAL | Status: DC | PRN

## 2014-04-29 MED ORDER — DILTIAZEM HCL 30 MG PO TABS: 30.0000 mg | ORAL_TABLET | Freq: Three times a day (TID) | ORAL | Status: DC | PRN

## 2014-04-29 NOTE — Progress Notes (Signed)
Patient ID: Chad Harrington, male    DOB: 1975/09/10, 38 y.o.   MRN: [id number]  HPI Comments: Chad Harrington is a very pleasant 38 year old gentleman history of chronic chest pain dating back to age 38, hypertension, who presents for routine follow-up  In follow-up today, Chad Harrington reports that his blood pressure has been running high. Amlodipine previously Chad Harrington was helping his blood pressure but this was stopped. Chad Harrington feels it was stopped for possible erectile dysfunction. In hindsight, Chad Harrington feel this was due to an infection which was treated with antibiotics, possible prostatitis Since then erectile dysfunction issues have resolved.  Occasionally has chest discomfort, but this was not discussed much through his visit today We focused abdominally on his blood pressure medications. Blood pressures and heart rate numbers were reviewed that they have recorded over the past several weeks. In general heart rate is 70-100, Systolic pressure 150 and higher  EKG done today in the office shows normal sinus rhythm with rate 68 bpm, no significant ST or T-wave changes  Other past medical history Previously Chad Harrington has had numerous trips to the emergency room, workup in the past. Chad Harrington has been treated with various medications including atenolol, Toprol with no improvement of his symptoms.  visits to the emergency room did not yield a reason for his chest pain. Felt to have GERD. Never tried proton pump inhibitors. Told to try H2 blockers such as Zantac. Chad Harrington has tried NSAIDs before with no relief of his symptoms.   Prior treadmill stress test showing no ischemia on EKG  Reports that Chad Harrington was diagnosed with mitral valve prolapse at age 38.   Also had episodes of sweating, tachycardia, weakness and dizziness at the end of 2013. No recent episodes   In terms of his family history, brother had CAD in his late 5630s. Chad Harrington is a smoker father also has history of CAD, stroke, CHF in his 4070s also a smoker   Terms of his social  history,  reports that Chad Harrington has never smoked. Chad Harrington has never used smokeless tobacco. Chad Harrington reports that Chad Harrington drinks alcohol. Chad Harrington reports that Chad Harrington does not use illicit drugs.   Outpatient Encounter Prescriptions as of 04/29/2014  Medication Sig  . diclofenac (VOLTAREN) 75 MG EC tablet Take 1 tablet (75 mg total) by mouth 2 (two) times daily.  . nitroGLYCERIN (NITROSTAT) 0.4 MG SL tablet Place 1 tablet (0.4 mg total) under the tongue every 5 (five) minutes as needed.  Marland Kitchen. omeprazole (PRILOSEC) 20 MG capsule Take 1 capsule (20 mg total) by mouth 2 (two) times daily.  . [DISCONTINUED] amLODipine (NORVASC) 10 MG tablet Take 1 tablet (10 mg total) by mouth daily.    Review of Systems  Constitutional: Negative.   HENT: Negative.   Eyes: Negative.   Respiratory: Negative.   Cardiovascular: Positive for chest pain.  Gastrointestinal: Negative.   Genitourinary:       Erectile dysfunction, now resolved  Musculoskeletal: Negative.   Skin: Negative.   Neurological: Negative.   Psychiatric/Behavioral: Negative.   All other systems reviewed and are negative.  BP 142/98 mmHg  Pulse 68  Ht 5\' 11"  (1.803 m)  Wt 223 lb 12 oz (101.492 kg)  BMI 31.22 kg/m2  Physical Exam  Constitutional: Chad Harrington is oriented to person, place, and time. Chad Harrington appears well-developed and well-nourished.  HENT:  Head: Normocephalic.  Nose: Nose normal.  Mouth/Throat: Oropharynx is clear and moist.  Eyes: Conjunctivae are normal. Pupils are equal, round, and reactive to light.  Neck: Normal range  of motion. Neck supple. No JVD present.  Cardiovascular: Normal rate, regular rhythm, S1 normal, S2 normal, normal heart sounds and intact distal pulses.  Exam reveals no gallop and no friction rub.   No murmur heard. Pulmonary/Chest: Effort normal and breath sounds normal. No respiratory distress. Chad Harrington has no wheezes. Chad Harrington has no rales. Chad Harrington exhibits no tenderness.  Abdominal: Soft. Bowel sounds are normal. Chad Harrington exhibits no distension. There is no  tenderness.  Musculoskeletal: Normal range of motion. Chad Harrington exhibits no edema or tenderness.  Lymphadenopathy:    Chad Harrington has no cervical adenopathy.  Neurological: Chad Harrington is alert and oriented to person, place, and time. Coordination normal.  Skin: Skin is warm and dry. No rash noted. No erythema.  Psychiatric: Chad Harrington has a normal mood and affect. His behavior is normal. Judgment and thought content normal.      Assessment and Plan   Nursing note and vitals reviewed.

## 2014-04-29 NOTE — Patient Instructions (Addendum)
You are doing well. Please start diltiazem 30 mg up to three times a day If tolerated, switch to the diltiazem 120 mg once a day  Ok to try trazodone 1/2 up to a whole pill, 50 mg   Please call us if you have new issues that need to be addressed before your next appt.  Your physician wants you to follow-up in: 6 months.  You will receive a reminder letter in the mail two months in advance. If you don't receive a letter, please call our office to schedule the follow-up appointment.  Your next appointment will be scheduled in our new office located at :  [facility]  [address], [address]  [place], [place] [id number]  Ask the pharmacist about verapamil pricing if diltiazem is expensive

## 2014-04-29 NOTE — Assessment & Plan Note (Signed)
Blood pressure and heart rate has been elevated. Previously his amlodipine was held but he denies having any side effects. Suggested he try diltiazem 120 mg daily. Diltiazem 30 mg pills have also been given to try before the 120 mg dose as well as for breakthrough high blood pressure. Further medication changes based on his blood pressure and heart rate numbers

## 2014-04-29 NOTE — Assessment & Plan Note (Addendum)
Not much of his visit today spent discussing his chest pain. Less of an issue. Prior treadmill stress test showing no EKG changes concerning for ischemia. No further workup needed. Unclear if he has anxiety issues

## 2014-05-01 ENCOUNTER — Encounter: Payer: Self-pay | Admitting: Cardiovascular Disease

## 2014-05-03 ENCOUNTER — Other Ambulatory Visit: Payer: Self-pay | Admitting: Cardiovascular Disease

## 2014-05-03 MED ORDER — TRAZODONE HCL 50 MG PO TABS: 50.0000 mg | ORAL_TABLET | Freq: Every evening | ORAL | Status: DC | PRN

## 2014-06-24 ENCOUNTER — Encounter: Payer: Self-pay | Admitting: *Deleted

## 2014-06-24 ENCOUNTER — Ambulatory Visit (INDEPENDENT_AMBULATORY_CARE_PROVIDER_SITE_OTHER): Payer: BLUE CROSS/BLUE SHIELD | Admitting: Internal Medicine

## 2014-06-24 ENCOUNTER — Encounter: Payer: Self-pay | Admitting: Internal Medicine

## 2014-06-24 VITALS — BP 142/96 | HR 73 | Temp 98.4°F | Wt 227.0 lb

## 2014-06-24 DIAGNOSIS — R351 Nocturia: Secondary | ICD-10-CM

## 2014-06-24 DIAGNOSIS — N400 Enlarged prostate without lower urinary tract symptoms: Secondary | ICD-10-CM

## 2014-06-24 DIAGNOSIS — N508 Other specified disorders of male genital organs: Secondary | ICD-10-CM

## 2014-06-24 DIAGNOSIS — IMO0002 Reserved for concepts with insufficient information to code with codable children: Secondary | ICD-10-CM

## 2014-06-24 LAB — POCT URINALYSIS DIPSTICK
BILIRUBIN UA: NEGATIVE
Blood, UA: NEGATIVE
Glucose, UA: NEGATIVE
KETONES UA: NEGATIVE
Leukocytes, UA: NEGATIVE
Nitrite, UA: NEGATIVE
PROTEIN UA: NEGATIVE
Spec Grav, UA: 1.02
Urobilinogen, UA: NEGATIVE
pH, UA: 6

## 2014-06-24 MED ORDER — TRAZODONE HCL 50 MG PO TABS: 50.0000 mg | ORAL_TABLET | Freq: Every evening | ORAL | Status: DC | PRN

## 2014-06-24 MED ORDER — TAMSULOSIN HCL 0.4 MG PO CAPS: 0.4000 mg | ORAL_CAPSULE | Freq: Every day | ORAL | Status: DC

## 2014-06-24 NOTE — Progress Notes (Signed)
Subjective:    Patient ID: Chad Harrington, male    DOB: 17-Jun-1976, 39 y.o.   MRN: [id number]  HPI  Pt presents to the clinic today with c/o testicular pain and difficulty mainaining an erection. He reports this started about 1 week ago. He did have similar symptoms 1 year ago and after a extensive workup,  was diagnosed with a prostate infection. His symptoms did improve with a four week course of Cipro. He has not noticed any swelling or redness of the testicles. The pain can occur with activity or rest. He denies urinary symptoms such as frequency, burning, or pain with urination. He denies penile discharge. He does get up at night to urinate multiple times.  Additionally, he would like a refill of his trazadone today. He takes it every night to help him sleep.  Review of Systems  Past Medical History  Diagnosis Date  . Mitral valve prolapse   . Prostate infection 04/2013    Current Outpatient Prescriptions  Medication Sig Dispense Refill  . diclofenac (VOLTAREN) 75 MG EC tablet Take 1 tablet (75 mg total) by mouth 2 (two) times daily. 60 tablet 5  . diltiazem (CARDIZEM CD) 120 MG 24 hr capsule Take 1 capsule (120 mg total) by mouth daily. 30 capsule 11  . diltiazem (CARDIZEM) 30 MG tablet Take 1 tablet (30 mg total) by mouth 3 (three) times daily as needed. 90 tablet 3  . nitroGLYCERIN (NITROSTAT) 0.4 MG SL tablet Place 1 tablet (0.4 mg total) under the tongue every 5 (five) minutes as needed. 25 tablet 3  . omeprazole (PRILOSEC) 20 MG capsule Take 1 capsule (20 mg total) by mouth 2 (two) times daily. 60 capsule 11  . traZODone (DESYREL) 50 MG tablet Take 1 tablet (50 mg total) by mouth at bedtime as needed for sleep. 30 tablet 1   No current facility-administered medications for this visit.    No Known Allergies  Family History  Problem Relation Age of Onset  . Diabetes Father   . COPD Father   . Hypertension Father   . Heart disease Father 48    MI  . Heart attack  Father   . Cancer Maternal Grandfather     colon CA    History   Social History  . Marital Status: Married    Spouse Name: N/A    Number of Children: N/A  . Years of Education: N/A   Occupational History  . Not on file.   Social History Main Topics  . Smoking status: Never Smoker   . Smokeless tobacco: Never Used  . Alcohol Use: Yes     Comment: occ  . Drug Use: No  . Sexual Activity: Not on file   Other Topics Concern  . Not on file   Social History Narrative     Constitutional: Denies fever, malaise, fatigue, headache or abrupt weight changes.   Gastrointestinal: Denies abdominal pain, bloating, constipation, diarrhea or blood in the stool.  GU: Pt reports ED and testicular pain. Denies urgency, frequency, pain with urination, burning sensation, blood in urine, odor or discharge. Musculoskeletal: Denies decrease in range of motion, difficulty with gait, muscle pain or joint pain and swelling.  Skin: Denies redness, rashes, lesions or ulcercations.  Neurological: Pt reports insomnia. Denies dizziness, difficulty with memory, difficulty with speech or problems with balance and coordination.   No other specific complaints in a complete review of systems (except as listed in HPI above).     Objective:  Physical Exam   BP 142/96 mmHg  Pulse 73  Temp(Src) 98.4 F (36.9 C) (Oral)  Wt 227 lb (102.967 kg)  SpO2 98% Wt Readings from Last 3 Encounters:  06/24/14 227 lb (102.967 kg)  04/29/14 223 lb 12 oz (101.492 kg)  02/21/14 213 lb 8 oz (96.843 kg)    General: Appears his stated age, well developed, well nourished in NAD. Skin: Warm, dry and intact. No rashes, lesions or ulcerations noted. Cardiovascular: Normal rate and rhythm. S1,S2 noted.  No murmur, rubs or gallops noted. Pulmonary/Chest: Normal effort and positive vesicular breath sounds. No respiratory distress. No wheezes, rales or ronchi noted.  Abdomen: Soft and nontender. Normal bowel sounds, no bruits  noted. No distention or masses noted. Liver, spleen and kidneys non palpable. Rectal: Normal rectal tone. Prostate slightly enlarged but not tender to palpation. Neurological: Alert and oriented.  Psychiatric: Mood and affect normal. Behavior is normal.      BMET    Component Value Date/Time   NA 137 05/14/2013 1002   K 4.0 05/14/2013 1002   CL 103 05/14/2013 1002   CO2 26 05/14/2013 1002   GLUCOSE 102* 05/14/2013 1002   BUN 12 05/14/2013 1002   CREATININE 1.1 05/14/2013 1002   CALCIUM 9.5 05/14/2013 1002    Lipid Panel     Component Value Date/Time   CHOL 138 08/28/2012 1426   TRIG 159.0* 08/28/2012 1426   HDL 33.40* 08/28/2012 1426   CHOLHDL 4 08/28/2012 1426   VLDL 31.8 08/28/2012 1426   LDLCALC 73 08/28/2012 1426    CBC No results found for: WBC, RBC, HGB, HCT, PLT, MCV, MCH, MCHC, RDW, LYMPHSABS, MONOABS, EOSABS, BASOSABS  Hgb A1C No results found for: HGBA1C      Assessment & Plan:   Testicular pain with erectile dysfunction, nocturia:  Urinalysis: normal DRE does not suggest prostatitis Reviewed ultrasound of scrotum from 12/14- small epididymal cyst otherwise normal He has some symptoms of BPF- discussed treatment with Flomax If no improvement or worse in the next 2 week period, please follow up with PCP or urologist

## 2014-06-24 NOTE — Patient Instructions (Signed)
Benign Prostatic Hypertrophy The prostate gland is part of the reproductive system of men. A normal prostate is about the size and shape of a walnut. The prostate gland produces a fluid that is mixed with sperm to make semen. This gland surrounds the urethra and is located in front of the rectum and just below the bladder. The bladder is where urine is stored. The urethra is the tube through which urine passes from the bladder to get out of the body. The prostate grows as a man ages. An enlarged prostate not caused by cancer is called benign prostatic hypertrophy (BPH). An enlarged prostate can press on the urethra. This can make it harder to pass urine. In the early stages of enlargement, the bladder can get by with a narrowed urethra by forcing the urine through. If the problem gets worse, medical or surgical treatment may be required.  This condition should be followed by your health care provider. The accumulation of urine in the bladder can cause infection. Back pressure and infection can progress to bladder damage and kidney (renal) failure. If needed, your health care provider may refer you to a specialist in kidney and prostate disease (urologist). CAUSES  BPH is a common health problem in men older than 50 years. This condition is a normal part of aging. However, not all men will develop problems from this condition. If the enlargement grows away from the urethra, then there will not be any compression of the urethra and resistance to urine flow.If the growth is toward the urethra and compresses it, you will experience difficulty urinating.  SYMPTOMS   Not able to completely empty your bladder.  Getting up often during the night to urinate.  Need to urinate frequently during the day.  Difficultly starting urine flow.  Decrease in size and strength of your urine stream.  Dribbling after urination.  Pain on urination (more common with infection).  Inability to pass urine. This needs  immediate treatment.  The development of a urinary tract infection. DIAGNOSIS  These tests will help your health care provider understand your problem:  A thorough history and physical examination.  A urination history, with the number of times you urinate, the amounts of urine, the strength of the urine stream, and the feeling of emptiness or fullness after urinating.  A postvoid bladder scan that measures any amount of urine that may remain in your bladder after you finish urinating.  Digital rectal exam. In a rectal exam, your health care provider checks your prostate by putting a gloved, lubricated finger into your rectum to feel the back of your prostate gland. This exam detects the size of your gland and abnormal lumps or growths.  Exam of your urine (urinalysis).  Prostate specific antigen (PSA) screening. This is a blood test used to screen for prostate cancer.  Rectal ultrasonography. This test uses sound waves to electronically produce a picture of your prostate gland. TREATMENT  Once symptoms begin, your health care provider will monitor your condition. Of the men with this condition, one third will have symptoms that stabilize, one third will have symptoms that improve, and one third will have symptoms that progress in the first year. Mild symptoms may not need treatment. Simple observation and yearly exams may be all that is required. Medicines and surgery are options for more severe problems. Your health care provider can help you make an informed decision for what is best. Two classes of medicines are available for relief of prostate symptoms:  Medicines   that shrink the prostate. This helps relieve symptoms. These medicines take time to work, and it may be months before any improvement is seen.  Uncommon side effects include problems with sexual function.  Medicines to relax the muscle of the prostate. This also relieves the obstruction by reducing any compression on the  urethra.This group of medicines work much faster than those that reduce the size of the prostate gland. Usually, one can experience improvement in days to weeks..  Side effects can include dizziness, fatigue, lightheadedness, and retrograde ejaculation (diminished volume of ejaculate). Several types of surgical treatments are available for relief of prostate symptoms:  Transurethral resection of the prostate (TURP)--In this treatment, an instrument is inserted through opening at the tip of the penis. It is used to cut away pieces of the inner core of the prostate. The pieces are removed through the same opening of the penis. This removes the obstruction and helps get rid of the symptoms.  Transurethral incision (TUIP)--In this procedure, small cuts are made in the prostate. This lessens the prostates pressure on the urethra.  Transurethral microwave thermotherapy (TUMT)--This procedure uses microwaves to create heat. The heat destroys and removes a small amount of prostate tissue.  Transurethral needle ablation (TUNA)--This is a procedure that uses radio frequencies to do the same as TUMT.  Interstitial laser coagulation (ILC)--This is a procedure that uses a laser to do the same as TUMT and TUNA.  Transurethral electrovaporization (TUVP)--This is a procedure that uses electrodes to do the same as the procedures listed above. SEEK MEDICAL CARE IF:   You develop a fever.  There is unexplained back pain.  Symptoms are not helped by medicines prescribed.  You develop side effects from the medicine you are taking.  Your urine becomes very dark or has a bad smell.  Your lower abdomen becomes distended and you have difficulty passing your urine. SEEK IMMEDIATE MEDICAL CARE IF:   You are suddenly unable to urinate. This is an emergency. You should be seen immediately.  There are large amounts of blood or clots in the urine.  Your urinary problems become unmanageable.  You develop  lightheadedness, severe dizziness, or you feel faint.  You develop moderate to severe low back or flank pain.  You develop chills or fever. Document Released: 06/07/2005 Document Revised: 06/12/2013 Document Reviewed: 12/21/2012 ExitCare Patient Information 2015 [employer]. This information is not intended to replace advice given to you by your health care provider. Make sure you discuss any questions you have with your health care provider.  

## 2014-06-24 NOTE — Progress Notes (Signed)
Pre visit review using our clinic review tool, if applicable. No additional management support is needed unless otherwise documented below in the visit note. 

## 2014-07-01 ENCOUNTER — Ambulatory Visit (INDEPENDENT_AMBULATORY_CARE_PROVIDER_SITE_OTHER): Payer: BLUE CROSS/BLUE SHIELD | Admitting: Family Medicine

## 2014-07-01 ENCOUNTER — Encounter: Payer: Self-pay | Admitting: Family Medicine

## 2014-07-01 VITALS — BP 144/80 | HR 82 | Temp 98.2°F | Wt 227.5 lb

## 2014-07-01 DIAGNOSIS — N509 Disorder of male genital organs, unspecified: Secondary | ICD-10-CM

## 2014-07-01 DIAGNOSIS — N50819 Testicular pain, unspecified: Secondary | ICD-10-CM | POA: Insufficient documentation

## 2014-07-01 MED ORDER — ESOMEPRAZOLE MAGNESIUM 40 MG PO CPDR: 40.0000 mg | DELAYED_RELEASE_CAPSULE | Freq: Every day | ORAL | Status: DC

## 2014-07-01 MED ORDER — DOXYCYCLINE HYCLATE 100 MG PO CAPS: 100.0000 mg | ORAL_CAPSULE | Freq: Two times a day (BID) | ORAL | Status: DC

## 2014-07-01 MED ORDER — CEFTRIAXONE SODIUM 250 MG IJ SOLR
250.0000 mg | Freq: Once | INTRAMUSCULAR | Status: AC
  Administered 2014-07-01: 250 mg via INTRAMUSCULAR

## 2014-07-01 NOTE — Patient Instructions (Signed)
Epididymitis °Epididymitis is a swelling (inflammation) of the epididymis. The epididymis is a cord-like structure along the back part of the testicle. Epididymitis is usually, but not always, caused by infection. This is usually a sudden problem beginning with chills, fever and pain behind the scrotum and in the testicle. There may be swelling and redness of the testicle. °DIAGNOSIS  °Physical examination will reveal a tender, swollen epididymis. Sometimes, cultures are obtained from the urine or from prostate secretions to help find out if there is an infection or if the cause is a different problem. Sometimes, blood work is performed to see if your white blood cell count is elevated and if a germ (bacterial) or viral infection is present. Using this knowledge, an appropriate medicine which kills germs (antibiotic) can be chosen by your caregiver. A viral infection causing epididymitis will most often go away (resolve) without treatment. °HOME CARE INSTRUCTIONS  °· Hot sitz baths for 20 minutes, 4 times per day, may help relieve pain. °· Only take over-the-counter or prescription medicines for pain, discomfort or fever as directed by your caregiver. °· Take all medicines, including antibiotics, as directed. Take the antibiotics for the full prescribed length of time even if you are feeling better. °· It is very important to keep all follow-up appointments. °SEEK IMMEDIATE MEDICAL CARE IF:  °· You have a fever. °· You have pain not relieved with medicines. °· You have any worsening of your problems. °· Your pain seems to come and go. °· You develop pain, redness, and swelling in the scrotum and surrounding areas. °MAKE SURE YOU:  °· Understand these instructions. °· Will watch your condition. °· Will get help right away if you are not doing well or get worse. °Document Released: 06/04/2000 Document Revised: 08/30/2011 Document Reviewed: 04/24/2009 °ExitCare® Patient Information ©2015 [employer]. This information  is not intended to replace advice given to you by your health care provider. Make sure you discuss any questions you have with your health care provider. ° °

## 2014-07-01 NOTE — Progress Notes (Signed)
Pre visit review using our clinic review tool, if applicable. No additional management support is needed unless otherwise documented below in the visit note. 

## 2014-07-01 NOTE — Assessment & Plan Note (Signed)
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 Of note, wife now having urinary complaints. Will order urine cx. Given Rocephin 250 mg IM x 1 today, 10 day course of doxycyline 100 mg twice daily for possible STI (wife getting tested as well). Refer back to urology. The patient indicates understanding of these issues and agrees with the plan.

## 2014-07-01 NOTE — Progress Notes (Signed)
Subjective:   Patient ID: Glenard HaringLeonard W Wegman, male    DOB: Oct 20, 1975, 39 y.o.   MRN: [id number]  Glenard HaringLeonard W Wrede is a pleasant 39 y.o. year old male who presents to clinic today with Follow-up  on 07/01/2014  HPI:  Leone BrandSaw Regina on 06/24/14 for testicular pain ongoing for 3 months.  Did have some scrotal swelling yesterday which has resolved.  Also having increased urinary frequency, ED and some intermittent dysuria and abdominal pain.  Note reviewed- she felt DRE consistent with some enlargement of prostate but not prostatitis.  Started on flomax and advised to follow up with me here today.  Of note, saw Dr. Para Marchuncan in 04/2013 for similar symptoms.  In fact he says the symptoms were exactly the same then.  Dr. Para Marchuncan referred him to urology.  Per pt, he was on abx for months for a "prostate infection."  Scrotal ultrasound from 05/28/13 showed:  CLINICAL DATA: Right scrotal pain for 1 month.  EXAM: SCROTAL ULTRASOUND  DOPPLER ULTRASOUND OF THE TESTICLES  TECHNIQUE: Complete ultrasound examination of the testicles, epididymis, and other scrotal structures was performed. Color and spectral Doppler ultrasound were also utilized to evaluate blood flow to the testicles.  COMPARISON: None.  FINDINGS: Right testicle  Measurements: 4.1 x 3.0 x 3.4 cm. . No mass or microlithiasis visualized.  Left testicle  Measurements: 4.6 x 2.6 x 3.6 cm . No mass or microlithiasis visualized.  Right epididymis: Normal in size and appearance.  Left epididymis: 0.5 cm spermatocele or epididymal cyst. Otherwise negative.  Hydrocele: None visualized.  Varicocele: None visualized.  Pulsed Doppler interrogation of both testes demonstrates low resistance arterial and venous waveforms bilaterally.  Mild scrotal skin swelling.  IMPRESSION: Mild scrotal skin swelling. Sonographic appearance of the testicles is unremarkable aside from a 0.5 cm spermatocele or epididymal  cyst in the left epididymis.  Current Outpatient Prescriptions on File Prior to Visit  Medication Sig Dispense Refill  . diclofenac (VOLTAREN) 75 MG EC tablet Take 1 tablet (75 mg total) by mouth 2 (two) times daily. 60 tablet 5  . diltiazem (CARDIZEM CD) 120 MG 24 hr capsule Take 1 capsule (120 mg total) by mouth daily. 30 capsule 11  . diltiazem (CARDIZEM) 30 MG tablet Take 1 tablet (30 mg total) by mouth 3 (three) times daily as needed. 90 tablet 3  . nitroGLYCERIN (NITROSTAT) 0.4 MG SL tablet Place 1 tablet (0.4 mg total) under the tongue every 5 (five) minutes as needed. 25 tablet 3  . tamsulosin (FLOMAX) 0.4 MG CAPS capsule Take 1 capsule (0.4 mg total) by mouth daily. 30 capsule 0  . traZODone (DESYREL) 50 MG tablet Take 1 tablet (50 mg total) by mouth at bedtime as needed for sleep. 30 tablet 0   No current facility-administered medications on file prior to visit.    No Known Allergies  Past Medical History  Diagnosis Date  . Mitral valve prolapse   . Prostate infection 04/2013    Past Surgical History  Procedure Laterality Date  . Knee surgery      Family History  Problem Relation Age of Onset  . Diabetes Father   . COPD Father   . Hypertension Father   . Heart disease Father [id number]    MI  . Heart attack Father   . Cancer Maternal Grandfather     colon CA    History   Social History  . Marital Status: Married    Spouse Name: N/A    Number of  Children: N/A  . Years of Education: N/A   Occupational History  . Not on file.   Social History Main Topics  . Smoking status: Never Smoker   . Smokeless tobacco: Never Used  . Alcohol Use: Yes     Comment: occ  . Drug Use: No  . Sexual Activity: Not on file   Other Topics Concern  . Not on file   Social History Narrative   The PMH, PSH, Social History, Family History, Medications, and allergies have been reviewed in [facility], and have been updated if relevant.  Review of Systems  Constitutional: Negative for  fever, chills, diaphoresis and fatigue.  Gastrointestinal: Positive for abdominal pain.  Genitourinary: Positive for dysuria, urgency, frequency, scrotal swelling, difficulty urinating and testicular pain. Negative for decreased urine volume, discharge, penile swelling and penile pain.  Musculoskeletal: Negative.   Skin: Negative.   Neurological: Negative.   Hematological: Negative.   Psychiatric/Behavioral: Negative.   All other systems reviewed and are negative.      Objective:    BP 144/80 mmHg  Pulse 82  Temp(Src) 98.2 F (36.8 C) (Oral)  Wt 227 lb 8 oz (103.193 kg)  SpO2 97%   Physical Exam  Constitutional: He is oriented to person, place, and time. He appears well-developed and well-nourished. No distress.  HENT:  Head: Normocephalic.  Eyes: Conjunctivae are normal.  Cardiovascular: Normal rate.   Pulmonary/Chest: Effort normal.  Abdominal: Soft. Bowel sounds are normal. He exhibits no distension and no mass. There is no tenderness. There is no rebound and no guarding.  Genitourinary: Cremasteric reflex is present. Right testis shows tenderness. Right testis shows no swelling. Left testis shows tenderness. Left testis shows no swelling. Circumcised.  TTP over right epididymis   Musculoskeletal: Normal range of motion.  Neurological: He is alert and oriented to person, place, and time. No cranial nerve deficit.  Skin: Skin is warm and dry.  Psychiatric: He has a normal mood and affect. His behavior is normal. Judgment and thought content normal.          Assessment & Plan:   Persistent testicular pain - Plan: Ambulatory referral to Urology, Urine culture, cefTRIAXone (ROCEPHIN) injection 250 mg No Follow-up on file.

## 2014-07-02 LAB — URINE CULTURE
Colony Count: NO GROWTH
Organism ID, Bacteria: NO GROWTH

## 2014-07-03 ENCOUNTER — Encounter: Payer: Self-pay | Admitting: Family Medicine

## 2014-07-04 ENCOUNTER — Other Ambulatory Visit: Payer: Self-pay | Admitting: Family Medicine

## 2014-07-04 MED ORDER — OMEPRAZOLE 40 MG PO CPDR: 40.0000 mg | DELAYED_RELEASE_CAPSULE | Freq: Every day | ORAL | Status: DC

## 2014-07-09 NOTE — Telephone Encounter (Signed)
This encounter was created in error - please disregard.

## 2014-08-01 ENCOUNTER — Other Ambulatory Visit: Payer: Self-pay | Admitting: Internal Medicine

## 2014-08-27 ENCOUNTER — Ambulatory Visit (INDEPENDENT_AMBULATORY_CARE_PROVIDER_SITE_OTHER): Payer: BLUE CROSS/BLUE SHIELD | Admitting: Primary Care

## 2014-08-27 ENCOUNTER — Encounter: Payer: Self-pay | Admitting: Primary Care

## 2014-08-27 VITALS — BP 136/68 | HR 82 | Temp 98.5°F | Wt 227.5 lb

## 2014-08-27 DIAGNOSIS — IMO0001 Reserved for inherently not codable concepts without codable children: Secondary | ICD-10-CM | POA: Insufficient documentation

## 2014-08-27 DIAGNOSIS — M25441 Effusion, right hand: Secondary | ICD-10-CM | POA: Diagnosis not present

## 2014-08-27 DIAGNOSIS — M79642 Pain in left hand: Secondary | ICD-10-CM | POA: Insufficient documentation

## 2014-08-27 MED ORDER — PREDNISONE 10 MG PO TABS: ORAL_TABLET | ORAL | Status: DC

## 2014-08-27 NOTE — Progress Notes (Signed)
   Subjective:    Patient ID: Chad Harrington, male    DOB: 1976-02-10, 39 y.o.   MRN: [id number]  HPI  Chad Harrington is a 39 year old male who presents today with a chief complaint of swelling and pain left 3rd digit to MCP joint for one year, and also pain to left dorsal side of hand (distally to 2nd digit) intermittently when he applices pressure. He works as a [employer]at[employer] and performs repetitive movements daily and reports increased pain and inflammation to the right MCP joint. He describes the pain to the right MCP joint as achy without stiffness, and pain to the right hand as absent during rest but sharp during movement. He has been taking voltaren PO as he does already for his knees and has added advil without relief. He's also tried ice which has resulted in no improvement. He reports that the pain isn't worse in the morning or evening, it's just present throughout the day, and denies recent trauma/injury.   Review of Systems  Constitutional: Negative for fever and chills.  HENT: Negative for rhinorrhea.   Respiratory: Negative for cough.   Gastrointestinal: Negative for nausea.  Musculoskeletal: Positive for myalgias and arthralgias.       See HPI  Skin: Negative for rash.  Neurological: Negative for dizziness.       Objective:   Physical Exam  Constitutional: He is oriented to person, place, and time. He appears well-developed.  HENT:  Head: Normocephalic.  Cardiovascular: Normal rate, regular rhythm and normal heart sounds.   Pulmonary/Chest: Effort normal and breath sounds normal.  Musculoskeletal:  Pain to left dorsal hand when applied pressure to flexed left wrist.  Mild erythema to right 3rd digit at MCP joint.  ROM bilaterally otherwise unremarkable.    Neurological: He is alert and oriented to person, place, and time.  Positive tinel's sign to bilateral hands/digits.  Skin: Skin is warm and dry. No rash noted.  Psychiatric: He has a normal mood and affect.           Assessment & Plan:

## 2014-08-27 NOTE — Patient Instructions (Addendum)
Take Prednisone as discussed and stop taking your Voltaren and ibuprofen until prednisone is complete. Purchase a brace for the left arm to help with stability. Follow up with PCP as needed.

## 2014-08-27 NOTE — Assessment & Plan Note (Signed)
Suspect this may be arthritis. Prednisone to reduce inflammation causing pain and erythema. D/C Voltaren and ibuprofen while on Prednisone. Follow up if no improvement in swelling.

## 2014-08-27 NOTE — Progress Notes (Signed)
Pre visit review using our clinic review tool, if applicable. No additional management support is needed unless otherwise documented below in the visit note. 

## 2014-08-27 NOTE — Assessment & Plan Note (Signed)
Pain upon applied pressure to flexed wrist. Positive Tinels sign bilaterally. Brace to left hand at night and throughout the day if possible for support.

## 2014-09-19 ENCOUNTER — Other Ambulatory Visit: Payer: Self-pay

## 2014-09-19 MED ORDER — TRAZODONE HCL 50 MG PO TABS: 50.0000 mg | ORAL_TABLET | Freq: Every evening | ORAL | Status: DC | PRN

## 2014-09-19 NOTE — Telephone Encounter (Signed)
Chad Harrington left v/m requesting refill trazodone to [facility] [address]. Chad Harrington request cb. Pt last f/u appt 07/01/14 and last annual exam 08/28/2012. No future appt scheduled.

## 2014-09-19 NOTE — Telephone Encounter (Signed)
Rx called in to requested pharmacy 

## 2014-10-10 ENCOUNTER — Other Ambulatory Visit: Payer: Self-pay | Admitting: *Deleted

## 2014-10-10 MED ORDER — TRAZODONE HCL 50 MG PO TABS: 50.0000 mg | ORAL_TABLET | Freq: Every evening | ORAL | Status: DC | PRN

## 2015-01-15 ENCOUNTER — Other Ambulatory Visit: Payer: Self-pay | Admitting: Family Medicine

## 2015-01-15 NOTE — Telephone Encounter (Signed)
Last Rx indicates OV required. Last f/u appt 05/2014

## 2015-02-18 ENCOUNTER — Encounter: Payer: Self-pay | Admitting: Cardiovascular Disease

## 2015-02-18 ENCOUNTER — Ambulatory Visit (INDEPENDENT_AMBULATORY_CARE_PROVIDER_SITE_OTHER): Payer: BLUE CROSS/BLUE SHIELD | Admitting: Cardiovascular Disease

## 2015-02-18 VITALS — BP 126/82 | HR 68 | Ht 71.0 in | Wt 224.2 lb

## 2015-02-18 DIAGNOSIS — R079 Chest pain, unspecified: Secondary | ICD-10-CM

## 2015-02-18 DIAGNOSIS — I10 Essential (primary) hypertension: Secondary | ICD-10-CM

## 2015-02-18 DIAGNOSIS — I479 Paroxysmal tachycardia, unspecified: Secondary | ICD-10-CM | POA: Insufficient documentation

## 2015-02-18 DIAGNOSIS — R0602 Shortness of breath: Secondary | ICD-10-CM | POA: Diagnosis not present

## 2015-02-18 DIAGNOSIS — R000 Tachycardia, unspecified: Secondary | ICD-10-CM

## 2015-02-18 DIAGNOSIS — R0789 Other chest pain: Secondary | ICD-10-CM

## 2015-02-18 DIAGNOSIS — I471 Supraventricular tachycardia: Secondary | ICD-10-CM

## 2015-02-18 MED ORDER — DILTIAZEM HCL ER COATED BEADS 120 MG PO CP24: 120.0000 mg | ORAL_CAPSULE | Freq: Every day | ORAL | Status: DC

## 2015-02-18 NOTE — Assessment & Plan Note (Signed)
Wife is concerned about the patient's chest discomfort and shortness of breath. We have discussed the various treatment options including routine treadmill study, or CT coronary calcium scoring. She would like him to be scheduled for routine treadmill study He did one at the end of 2014 and did well I suspect symptoms are atypical in nature but we'll schedule this for him for reassurance

## 2015-02-18 NOTE — Patient Instructions (Signed)
You are doing well. No medication changes were made.  We will set up a exercise treadmill for chest pain and shortness of breath  Please call us if you have new issues that need to be addressed before your next appt.

## 2015-02-18 NOTE — Assessment & Plan Note (Signed)
Patient reports  significant improvement on the diltiazem 120 minute grams daily. Both blood pressure and heart rate are improved. We have suggested if he has breakthrough arrhythmia, that he call our office. Monitor could be performed

## 2015-02-18 NOTE — Assessment & Plan Note (Signed)
Etiology unclear. Possibly from working in the heat this summer. 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 The wife reports he has symptoms even in air conditioning. Clinical exam benign No smoking history. We'll schedule him for routine treadmill study at his request

## 2015-02-18 NOTE — Progress Notes (Signed)
Patient ID: Chad Harrington, male    DOB: [birth year], 39 y.o.   MRN: [id number]  HPI Comments: Chad Harrington is a very pleasant 39 year old gentleman history of chronic chest pain dating back to age 23, hypertension, who presents for routine follow-up for his chest pain  In follow-up today, he reports having shortness of breath with exertion, Not only in the heat, but also at home. Chest pain at times, periods  of lightheadedness, tachycardia Wife reports that he has significant snoring Blood pressure has been well-controlled on his diltiazem 120  mg daily He has not had to use diltiazem 30 mg pills for breakthrough palpitations Wife is concerned about his symptoms He is never smoked  He reports that his father passed away recently from CHF, MI, COPD Brother also with sleep apnea, COPD Both his brother and father were smokers  EKG on today's visit shows normal sinus rhythm with rate 68 bpm, no significant ST or T-wave changes  Other past medical history Previously he has had numerous trips to the emergency room, workup in the past. He has been treated with various medications including atenolol, Toprol with no improvement of his symptoms.  visits to the emergency room did not yield a reason for his chest pain. Felt to have GERD. Never tried proton pump inhibitors. Told to try H2 blockers such as Zantac. He has tried NSAIDs before with no relief of his symptoms.   Prior treadmill stress test showing no ischemia on EKG  Reports that he was diagnosed with mitral valve prolapse at age 39.   Also had episodes of sweating, tachycardia, weakness and dizziness at the end of 2013. No recent episodes   In terms of his family history, brother had CAD in his late 61s. He is a smoker father also has history of CAD, stroke, CHF in his 64s also a smoker   Terms of his social history,  reports that he has never smoked. He has never used smokeless tobacco. He reports that he drinks alcohol. He  reports that he does not use illicit drugs.  No Known Allergies  Current Outpatient Prescriptions on File Prior to Visit  Medication Sig Dispense Refill  . diclofenac (VOLTAREN) 75 MG EC tablet Take 1 tablet (75 mg total) by mouth 2 (two) times daily. (Patient taking differently: Take 75 mg by mouth as needed. ) 60 tablet 5  . diltiazem (CARDIZEM) 30 MG tablet Take 1 tablet (30 mg total) by mouth 3 (three) times daily as needed. 90 tablet 3  . nitroGLYCERIN (NITROSTAT) 0.4 MG SL tablet Place 1 tablet (0.4 mg total) under the tongue every 5 (five) minutes as needed. 25 tablet 3  . omeprazole (PRILOSEC) 40 MG capsule Take 1 capsule (40 mg total) by mouth daily. 30 capsule 3  . traZODone (DESYREL) 50 MG tablet TAKE ONE TABLET BY MOUTH AT BEDTIME AS NEEDED FOR SLEEP* NEEDS OFFICE VISIT FOR FURTHER REFILLS* 30 tablet 0   No current facility-administered medications on file prior to visit.    Past Medical History  Diagnosis Date  . Mitral valve prolapse   . Prostate infection 04/2013    Past Surgical History  Procedure Laterality Date  . Knee surgery      Social History  reports that he has never smoked. He has never used smokeless tobacco. He reports that he does not drink alcohol or use illicit drugs.  Family History family history includes COPD in his father; Cancer in his maternal grandfather; Diabetes in his father;  Heart attack in his father; Heart disease (age of onset: 80) in his father; Hypertension in his father.   Review of Systems  Constitutional: Negative.   Respiratory: Positive for shortness of breath.   Cardiovascular: Positive for chest pain.  Gastrointestinal: Negative.   Genitourinary:       Erectile dysfunction, now resolved  Musculoskeletal: Negative.   Neurological: Negative.   Psychiatric/Behavioral: Negative.   All other systems reviewed and are negative.  BP 126/82 mmHg  Pulse 68  Ht 5\' 11"  (1.803 m)  Wt 224 lb 4 oz (101.719 kg)  BMI 31.29  kg/m2  Physical Exam  Constitutional: He is oriented to person, place, and time. He appears well-developed and well-nourished.  HENT:  Head: Normocephalic.  Nose: Nose normal.  Mouth/Throat: Oropharynx is clear and moist.  Eyes: Conjunctivae are normal. Pupils are equal, round, and reactive to light.  Neck: Normal range of motion. Neck supple. No JVD present.  Cardiovascular: Normal rate, regular rhythm, S1 normal, S2 normal, normal heart sounds and intact distal pulses.  Exam reveals no gallop and no friction rub.   No murmur heard. Pulmonary/Chest: Effort normal and breath sounds normal. No respiratory distress. He has no wheezes. He has no rales. He exhibits no tenderness.  Abdominal: Soft. Bowel sounds are normal. He exhibits no distension. There is no tenderness.  Musculoskeletal: Normal range of motion. He exhibits no edema or tenderness.  Lymphadenopathy:    He has no cervical adenopathy.  Neurological: He is alert and oriented to person, place, and time. Coordination normal.  Skin: Skin is warm and dry. No rash noted. No erythema.  Psychiatric: He has a normal mood and affect. His behavior is normal. Judgment and thought content normal.      Assessment and Plan   Nursing note and vitals reviewed.

## 2015-02-18 NOTE — Assessment & Plan Note (Signed)
Blood pressure is well controlled on today's visit. No changes made to the medications. 

## 2015-02-25 ENCOUNTER — Other Ambulatory Visit (INDEPENDENT_AMBULATORY_CARE_PROVIDER_SITE_OTHER): Payer: BLUE CROSS/BLUE SHIELD

## 2015-02-25 ENCOUNTER — Other Ambulatory Visit: Payer: Self-pay | Admitting: Family Medicine

## 2015-02-25 DIAGNOSIS — Z0000 Encounter for general adult medical examination without abnormal findings: Secondary | ICD-10-CM | POA: Insufficient documentation

## 2015-02-25 LAB — COMPREHENSIVE METABOLIC PANEL
ALBUMIN: 4.3 g/dL (ref 3.5–5.2)
ALK PHOS: 89 U/L (ref 39–117)
ALT: 26 U/L (ref 0–53)
AST: 24 U/L (ref 0–37)
BUN: 20 mg/dL (ref 6–23)
CALCIUM: 9.3 mg/dL (ref 8.4–10.5)
CO2: 23 mEq/L (ref 19–32)
Chloride: 105 mEq/L (ref 96–112)
Creatinine, Ser: 1.1 mg/dL (ref 0.40–1.50)
GFR: 79.29 mL/min (ref >60.00)
GLUCOSE: 100 mg/dL — AB (ref 70–99)
POTASSIUM: 4 meq/L (ref 3.5–5.1)
Sodium: 137 mEq/L (ref 135–145)
TOTAL PROTEIN: 7.5 g/dL (ref 6.0–8.3)
Total Bilirubin: 0.5 mg/dL (ref 0.2–1.2)

## 2015-02-25 LAB — CBC WITH DIFFERENTIAL/PLATELET
Basophils Absolute: 0 10*3/uL (ref 0.0–0.1)
Basophils Relative: 0.4 % (ref 0.0–3.0)
EOS PCT: 4.4 % (ref 0.0–5.0)
Eosinophils Absolute: 0.3 10*3/uL (ref 0.0–0.7)
HEMATOCRIT: 47 % (ref 39.0–52.0)
Hemoglobin: 15.6 g/dL (ref 13.0–17.0)
LYMPHS ABS: 1.9 10*3/uL (ref 0.7–4.0)
Lymphocytes Relative: 25.3 % (ref 12.0–46.0)
MCHC: 33.3 g/dL (ref 30.0–36.0)
MCV: 92.1 fl (ref 78.0–100.0)
MONOS PCT: 11 % (ref 3.0–12.0)
Monocytes Absolute: 0.8 10*3/uL (ref 0.1–1.0)
NEUTROS PCT: 58.9 % (ref 43.0–77.0)
Neutro Abs: 4.5 10*3/uL (ref 1.4–7.7)
Platelets: 305 10*3/uL (ref 150.0–400.0)
RBC: 5.1 Mil/uL (ref 4.22–5.81)
RDW: 14.8 % (ref 11.5–15.5)
WBC: 7.6 10*3/uL (ref 4.0–10.5)

## 2015-02-25 LAB — LIPID PANEL
CHOLESTEROL: 171 mg/dL (ref 0–200)
HDL: 38.3 mg/dL — AB (ref >39.00)
LDL Cholesterol: 113 mg/dL — ABNORMAL HIGH (ref 0–99)
NonHDL: 132.86
TRIGLYCERIDES: 97 mg/dL (ref 0.0–149.0)
Total CHOL/HDL Ratio: 4
VLDL: 19.4 mg/dL (ref 0.0–40.0)

## 2015-02-25 LAB — HIV ANTIBODY (ROUTINE TESTING W REFLEX): HIV 1&2 Ab, 4th Generation: NONREACTIVE

## 2015-02-27 ENCOUNTER — Encounter: Payer: Self-pay | Admitting: Family Medicine

## 2015-02-27 ENCOUNTER — Ambulatory Visit (INDEPENDENT_AMBULATORY_CARE_PROVIDER_SITE_OTHER): Payer: BLUE CROSS/BLUE SHIELD | Admitting: Family Medicine

## 2015-02-27 VITALS — BP 152/92 | HR 83 | Temp 98.1°F | Ht 71.0 in | Wt 218.2 lb

## 2015-02-27 DIAGNOSIS — Z0000 Encounter for general adult medical examination without abnormal findings: Secondary | ICD-10-CM

## 2015-02-27 DIAGNOSIS — N50819 Testicular pain, unspecified: Secondary | ICD-10-CM

## 2015-02-27 DIAGNOSIS — N509 Disorder of male genital organs, unspecified: Secondary | ICD-10-CM

## 2015-02-27 DIAGNOSIS — R0789 Other chest pain: Secondary | ICD-10-CM

## 2015-02-27 DIAGNOSIS — I10 Essential (primary) hypertension: Secondary | ICD-10-CM

## 2015-02-27 MED ORDER — TRAZODONE HCL 50 MG PO TABS: ORAL_TABLET | ORAL | Status: DC

## 2015-02-27 NOTE — Assessment & Plan Note (Signed)
Reviewed preventive care protocols, scheduled due services, and updated immunizations Discussed nutrition, exercise, diet, and healthy lifestyle.  

## 2015-02-27 NOTE — Assessment & Plan Note (Signed)
Persistent. Pt would like a second urological opinion- referral placed.

## 2015-02-27 NOTE — Progress Notes (Signed)
Pre visit review using our clinic review tool, if applicable. No additional management support is needed unless otherwise documented below in the visit note. 

## 2015-02-27 NOTE — Assessment & Plan Note (Signed)
Stress test scheduled for next Thursday.

## 2015-02-27 NOTE — Progress Notes (Signed)
Subjective:   Patient ID: Chad Harrington, male    DOB: 10-Feb-1976, 39 y.o.   MRN: [id number]  Chad Harrington is a pleasant 39 y.o. year old male who presents to clinic today with Annual Exam  and follow up of chronic medical conditions on 02/27/2015  HPI:  Persistent chest pain with rapid heart beat- long standing history of this complaint (since he was a teenager),  followed by cardiology. Last saw Dr. Mariah Milling on 02/18/15.  Note reviewed. Advised to continue diltiazem at current dose.  Given persistent CP and SOB, he was scheduled for treadmill stress test next week. BP has been well controlled.  Has not yet taken his rx today.  Persistent testicular pain- followed by Dr. Ronal Fear at [facility] urology.  Just saw him last week- awaiting records.  Placed him on Flagyl per pt, which he stopped taking because it upset his stomach. Lab Results  Component Value Date   CHOL 171 02/25/2015   HDL 38.30* 02/25/2015   LDLCALC 113* 02/25/2015   TRIG 97.0 02/25/2015   CHOLHDL 4 02/25/2015   Lab Results  Component Value Date   CREATININE 1.10 02/25/2015   Lab Results  Component Value Date   WBC 7.6 02/25/2015   HGB 15.6 02/25/2015   HCT 47.0 02/25/2015   MCV 92.1 02/25/2015   PLT 305.0 02/25/2015   Lab Results  Component Value Date   TSH 1.23 08/28/2012   Lab Results  Component Value Date   NA 137 02/25/2015   K 4.0 02/25/2015   CL 105 02/25/2015   CO2 23 02/25/2015   Lab Results  Component Value Date   ALT 26 02/25/2015   AST 24 02/25/2015   ALKPHOS 89 02/25/2015   BILITOT 0.5 02/25/2015   Current Outpatient Prescriptions on File Prior to Visit  Medication Sig Dispense Refill  . diclofenac (VOLTAREN) 75 MG EC tablet Take 1 tablet (75 mg total) by mouth 2 (two) times daily. (Patient taking differently: Take 75 mg by mouth as needed. ) 60 tablet 5  . diltiazem (CARDIZEM CD) 120 MG 24 hr capsule Take 1 capsule (120 mg total) by mouth daily. 90 capsule 4  . diltiazem  (CARDIZEM) 30 MG tablet Take 1 tablet (30 mg total) by mouth 3 (three) times daily as needed. 90 tablet 3  . nitroGLYCERIN (NITROSTAT) 0.4 MG SL tablet Place 1 tablet (0.4 mg total) under the tongue every 5 (five) minutes as needed. 25 tablet 3  . omeprazole (PRILOSEC) 40 MG capsule Take 1 capsule (40 mg total) by mouth daily. 30 capsule 3  . traZODone (DESYREL) 50 MG tablet TAKE ONE TABLET BY MOUTH AT BEDTIME AS NEEDED FOR SLEEP* NEEDS OFFICE VISIT FOR FURTHER REFILLS* 30 tablet 0   No current facility-administered medications on file prior to visit.    No Known Allergies  Past Medical History  Diagnosis Date  . Mitral valve prolapse   . Prostate infection 04/2013    Past Surgical History  Procedure Laterality Date  . Knee surgery      Family History  Problem Relation Age of Onset  . Diabetes Father   . COPD Father   . Hypertension Father   . Heart disease Father 7    MI  . Heart attack Father   . Cancer Maternal Grandfather     colon CA    Social History   Social History  . Marital Status: Married    Spouse Name: N/A  . Number of Children: N/A  .  Years of Education: N/A   Occupational History  . Not on file.   Social History Main Topics  . Smoking status: Never Smoker   . Smokeless tobacco: Never Used  . Alcohol Use: No  . Drug Use: No  . Sexual Activity: Not on file   Other Topics Concern  . Not on file   Social History Narrative   The PMH, PSH, Social History, Family History, Medications, and allergies have been reviewed in [facility], and have been updated if relevant.   Review of Systems  Constitutional: Negative.   HENT: Negative.   Eyes: Negative.   Respiratory: Negative.   Cardiovascular: Negative.   Gastrointestinal: Negative.   Endocrine: Negative.   Genitourinary: Positive for penile pain and testicular pain.  Musculoskeletal: Negative.   Skin: Negative.   Allergic/Immunologic: Negative.   Neurological: Negative.   Hematological: Negative.    Psychiatric/Behavioral: Negative.   All other systems reviewed and are negative.      Objective:    BP 152/92 mmHg  Pulse 83  Temp(Src) 98.1 F (36.7 C) (Oral)  Ht  (1.803 m)  Wt 218 lb 4 oz (98.998 kg)  BMI 30.45 kg/m2  SpO2 97%  BP Readings from Last 3 Encounters:  02/27/15 152/92  02/18/15 126/82  08/27/14 136/68     Physical Exam  Constitutional: He is oriented to person, place, and time. He appears well-developed and well-nourished. No distress.  HENT:  Head: Normocephalic.  Eyes: Pupils are equal, round, and reactive to light.  Neck: Neck supple.  Cardiovascular: Normal rate and regular rhythm.   Pulmonary/Chest: Effort normal and breath sounds normal.  Abdominal: Soft.  Neurological: He is alert and oriented to person, place, and time. No cranial nerve deficit.  Skin: Skin is warm and dry.  Psychiatric: He has a normal mood and affect. His behavior is normal. Judgment and thought content normal.          Assessment & Plan:   Chest discomfort  Essential hypertension  Persistent testicular pain  Visit for well man health check No Follow-up on file.

## 2015-02-27 NOTE — Patient Instructions (Signed)
Great to see you. Please stop by to see Chad Harrington on your way out.   

## 2015-03-06 ENCOUNTER — Ambulatory Visit (INDEPENDENT_AMBULATORY_CARE_PROVIDER_SITE_OTHER): Payer: BLUE CROSS/BLUE SHIELD

## 2015-03-06 DIAGNOSIS — R079 Chest pain, unspecified: Secondary | ICD-10-CM

## 2015-03-06 DIAGNOSIS — R0602 Shortness of breath: Secondary | ICD-10-CM

## 2015-03-06 DIAGNOSIS — R000 Tachycardia, unspecified: Secondary | ICD-10-CM

## 2015-03-08 LAB — EXERCISE TOLERANCE TEST
CHL CUP RESTING HR STRESS: 84 {beats}/min
CSEPED: 9 min
CSEPHR: 90 %
CSEPPHR: 164 {beats}/min
Estimated workload: 10.5 METS
Exercise duration (sec): 16 s
MPHR: 182 {beats}/min

## 2015-08-18 ENCOUNTER — Other Ambulatory Visit: Payer: Self-pay | Admitting: Family Medicine

## 2015-12-17 ENCOUNTER — Encounter: Payer: Self-pay | Admitting: Family Medicine

## 2016-05-17 ENCOUNTER — Other Ambulatory Visit: Payer: Self-pay | Admitting: Cardiovascular Disease

## 2016-05-19 ENCOUNTER — Other Ambulatory Visit: Payer: Self-pay | Admitting: Cardiovascular Disease

## 2016-05-21 ENCOUNTER — Other Ambulatory Visit: Payer: Self-pay | Admitting: Cardiovascular Disease

## 2016-05-27 ENCOUNTER — Other Ambulatory Visit: Payer: Self-pay | Admitting: Cardiovascular Disease

## 2016-05-31 ENCOUNTER — Telehealth: Payer: Self-pay | Admitting: Cardiovascular Disease

## 2016-05-31 MED ORDER — DILTIAZEM HCL ER COATED BEADS 120 MG PO CP24: 120.0000 mg | ORAL_CAPSULE | Freq: Every day | ORAL | 0 refills | Status: DC

## 2016-05-31 NOTE — Telephone Encounter (Signed)
*  STAT* If patient is at the pharmacy, call can be transferred to refill team.   1. Which medications need to be refilled? (please list name of each medication and dose if known) diltiazem (CARDIZEM CD) 120 MG 24 hr capsule  2. Which pharmacy/location (including street and city if local pharmacy) is medication to be sent to? [address]  3. Do they need a 30 day or 90 day supply? 90 day

## 2016-06-28 ENCOUNTER — Ambulatory Visit (INDEPENDENT_AMBULATORY_CARE_PROVIDER_SITE_OTHER): Payer: BLUE CROSS/BLUE SHIELD | Admitting: Cardiovascular Disease

## 2016-06-28 ENCOUNTER — Encounter: Payer: Self-pay | Admitting: Family Medicine

## 2016-06-28 ENCOUNTER — Encounter: Payer: Self-pay | Admitting: Cardiovascular Disease

## 2016-06-28 VITALS — BP 126/84 | HR 75 | Ht 71.0 in | Wt 223.5 lb

## 2016-06-28 DIAGNOSIS — I10 Essential (primary) hypertension: Secondary | ICD-10-CM

## 2016-06-28 DIAGNOSIS — I471 Supraventricular tachycardia: Secondary | ICD-10-CM

## 2016-06-28 DIAGNOSIS — R0789 Other chest pain: Secondary | ICD-10-CM

## 2016-06-28 DIAGNOSIS — R0602 Shortness of breath: Secondary | ICD-10-CM

## 2016-06-28 MED ORDER — DILTIAZEM HCL ER COATED BEADS 120 MG PO CP24: 120.0000 mg | ORAL_CAPSULE | Freq: Every day | ORAL | 4 refills | Status: DC

## 2016-06-28 NOTE — Progress Notes (Signed)
Cardiology Office Note  Date:  06/28/2016   ID:  Fredick, Schlosser 1976-05-03, MRN [id number]  PCP:  Ruthe Mannan, MD   Chief Complaint  Patient presents with  . other    OD f/u. Last seen 02/18/15. Pt states he has been doing well. Reviewed meds with pt verbally.    HPI:  Mr. Auxier is a very pleasant 41 year old gentleman history of chronic chest pain dating back to age 72, hypertension, who presents for routine follow-up for his chest pain. Father,  3 girls all teenagers  In follow-up today, he reports that he feels well with no complaints Denies any shortness of breath or chest discomfort Wife reports that he has significant snoring Denies apnea Blood pressure has been well-controlled on his diltiazem 120  mg daily He has not had to use diltiazem 30 mg pills for breakthrough palpitations  He is never smoked No diabetes,  He does report having some difficulty with swallowing Interested in seeing GI  father passed away from CHF, MI, COPD Brother also with sleep apnea, COPD Both his brother and father were smokers  EKG on today's visit shows normal sinus rhythm with rate 75  bpm, no significant ST or T-wave changes  Other past medical history Previously he has had numerous trips to the emergency room, workup in the past. He has been treated with various medications including atenolol, Toprol with no improvement of his symptoms.  visits to the emergency room did not yield a reason for his chest pain. Felt to have GERD. Never tried proton pump inhibitors. Told to try H2 blockers such as Zantac. He has tried NSAIDs before with no relief of his symptoms.   Prior treadmill stress test showing no ischemia on EKG  Reports that he was diagnosed with mitral valve prolapse at age 65.   Also had episodes of sweating, tachycardia, weakness and dizziness at the end of 2013. No recent episodes   In terms of his family history, brother had CAD in his late 35s. He is a  smoker father also has history of CAD, stroke, CHF in his 52s also a smoker   Terms of his social history,  reports that he has never smoked. He has never used smokeless tobacco. He reports that he drinks alcohol. He reports that he does not use illicit drugs.  PMH:   has a past medical history of Mitral valve prolapse and Prostate infection (04/2013).  PSH:    Past Surgical History:  Procedure Laterality Date  . KNEE SURGERY      Current Outpatient Prescriptions  Medication Sig Dispense Refill  . diltiazem (CARDIZEM CD) 120 MG 24 hr capsule Take 1 capsule (120 mg total) by mouth daily. 90 capsule 4  . omeprazole (PRILOSEC) 40 MG capsule Take 1 capsule (40 mg total) by mouth daily. 30 capsule 3  . tamsulosin (FLOMAX) 0.4 MG CAPS capsule Take 0.4 mg by mouth daily.     No current facility-administered medications for this visit.      Allergies:   Patient has no known allergies.   Social History:  The patient  reports that he has never smoked. He has never used smokeless tobacco. He reports that he drinks alcohol. He reports that he does not use drugs.   Family History:   family history includes COPD in his father; Cancer in his maternal grandfather; Diabetes in his father; Heart attack in his father; Heart disease (age of onset: 76) in his father; Hypertension in his father.  Review of Systems: Review of Systems  Constitutional: Negative.   Respiratory: Negative.   Cardiovascular: Negative.   Gastrointestinal: Negative.   Musculoskeletal: Negative.   Neurological: Negative.   Psychiatric/Behavioral: Negative.   All other systems reviewed and are negative.    PHYSICAL EXAM: VS:  BP 126/84 (BP Location: Left Arm, Patient Position: Sitting, Cuff Size: Normal)   Pulse 75   Ht 5\' 11"  (1.803 m)   Wt 223 lb 8 oz (101.4 kg)   BMI 31.17 kg/m  , BMI Body mass index is 31.17 kg/m. GEN: Well nourished, well developed, in no acute distress  HEENT: normal  Neck: no JVD,  carotid bruits, or masses Cardiac: RRR; no murmurs, rubs, or gallops,no edema  Respiratory:  clear to auscultation bilaterally, normal work of breathing GI: soft, nontender, nondistended, + BS MS: no deformity or atrophy  Skin: warm and dry, no rash Neuro:  Strength and sensation are intact Psych: euthymic mood, full affect    Recent Labs: No results found for requested labs within last 8760 hours.    Lipid Panel Lab Results  Component Value Date   CHOL 171 02/25/2015   HDL 38.30 (L) 02/25/2015   LDLCALC 113 (H) 02/25/2015   TRIG 97.0 02/25/2015      Wt Readings from Last 3 Encounters:  06/28/16 223 lb 8 oz (101.4 kg)  02/27/15 218 lb 4 oz (99 kg)  02/18/15 224 lb 4 oz (101.7 kg)       ASSESSMENT AND PLAN:  Essential hypertension - Plan: EKG 12-Lead Blood pressure is well controlled on today's visit. No changes made to the medications.  Chest discomfort He denies any significant chest pain No further testing needed  Shortness of breath Denies any shortness of breath, Appears euvolemic. Nonsmoker, low risk factors  Paroxysmal supraventricular tachycardia (HCC) Denies any tachycardia concerning for arrhythmia   Total encounter time more than 15 minutes  Greater than 50% was spent in counseling and coordination of care with the patient   Disposition:   F/U  as needed   Orders Placed This Encounter  Procedures  . EKG 12-Lead     Signed, Dossie Arbourim Gollan, M.D., Ph.D. 06/28/2016  [facility] [place] [phone]

## 2016-06-28 NOTE — Patient Instructions (Addendum)
Medication Instructions:   No medication changes made Medications renewed  Labwork:  No new labs needed  Testing/Procedures:  No further testing at this time  Dr. Servando SnareWohl (GI) for GI issues   I recommend watching educational videos on topics of interest to you at:       [email or web address].com  Enter code: HEARTCARE    Follow-Up: It was a pleasure seeing you in the office today. Please call us if you have new issues that need to be addressed before your next appt.  [phone]  Your physician wants you to follow-up in: 6 months.  You will receive a reminder letter in the mail two months in advance. If you don't receive a letter, please call our office to schedule the follow-up appointment.  If you need a refill on your cardiac medications before your next appointment, please call your pharmacy.

## 2016-06-29 ENCOUNTER — Encounter: Payer: Self-pay | Admitting: Family Medicine

## 2016-06-29 ENCOUNTER — Other Ambulatory Visit: Payer: Self-pay | Admitting: Family Medicine

## 2016-06-29 MED ORDER — TAMSULOSIN HCL 0.4 MG PO CAPS: 0.4000 mg | ORAL_CAPSULE | Freq: Every day | ORAL | 3 refills | Status: DC

## 2016-07-09 ENCOUNTER — Other Ambulatory Visit: Payer: Self-pay | Admitting: Family Medicine

## 2016-08-08 ENCOUNTER — Emergency Department: Payer: BLUE CROSS/BLUE SHIELD

## 2016-08-08 ENCOUNTER — Emergency Department
Admission: EM | Admit: 2016-08-08 | Discharge: 2016-08-08 | Disposition: A | Discharge status: Home / Self Care | Payer: BLUE CROSS/BLUE SHIELD | Attending: Emergency Medicine | Admitting: Emergency Medicine

## 2016-08-08 ENCOUNTER — Encounter: Payer: Self-pay | Admitting: Emergency Medicine

## 2016-08-08 DIAGNOSIS — R519 Headache, unspecified: Secondary | ICD-10-CM

## 2016-08-08 DIAGNOSIS — I10 Essential (primary) hypertension: Secondary | ICD-10-CM | POA: Insufficient documentation

## 2016-08-08 DIAGNOSIS — R51 Headache: Secondary | ICD-10-CM | POA: Insufficient documentation

## 2016-08-08 DIAGNOSIS — H538 Other visual disturbances: Secondary | ICD-10-CM | POA: Insufficient documentation

## 2016-08-08 HISTORY — DX: Benign prostatic hyperplasia without lower urinary tract symptoms: N40.0

## 2016-08-08 HISTORY — DX: Unspecified atrial fibrillation: I48.91

## 2016-08-08 LAB — COMPREHENSIVE METABOLIC PANEL
ALBUMIN: 4.2 g/dL (ref 3.5–5.0)
ALT: 32 U/L (ref 17–63)
ANION GAP: 7 (ref 5–15)
AST: 34 U/L (ref 15–41)
Alkaline Phosphatase: 69 U/L (ref 38–126)
BUN: 11 mg/dL (ref 6–20)
CO2: 25 mmol/L (ref 22–32)
Calcium: 9 mg/dL (ref 8.9–10.3)
Chloride: 106 mmol/L (ref 101–111)
Creatinine, Ser: 1.05 mg/dL (ref 0.61–1.24)
GFR calc non Af Amer: >60 mL/min (ref >60)
Glucose, Bld: 109 mg/dL — ABNORMAL HIGH (ref 65–99)
POTASSIUM: 4.4 mmol/L (ref 3.5–5.1)
SODIUM: 138 mmol/L (ref 135–145)
Total Bilirubin: 0.9 mg/dL (ref 0.3–1.2)
Total Protein: 6.9 g/dL (ref 6.5–8.1)

## 2016-08-08 LAB — CBC
HCT: 48.3 % (ref 40.0–52.0)
HEMOGLOBIN: 16.3 g/dL (ref 13.0–18.0)
MCH: 30.7 pg (ref 26.0–34.0)
MCHC: 33.7 g/dL (ref 32.0–36.0)
MCV: 91.1 fL (ref 80.0–100.0)
PLATELETS: 321 10*3/uL (ref 150–440)
RBC: 5.3 MIL/uL (ref 4.40–5.90)
RDW: 13.7 % (ref 11.5–14.5)
WBC: 7 10*3/uL (ref 3.8–10.6)

## 2016-08-08 LAB — TROPONIN I: Troponin I: <0.03 ng/mL (ref <0.03)

## 2016-08-08 MED ORDER — GADOBENATE DIMEGLUMINE 529 MG/ML IV SOLN
20.0000 mL | Freq: Once | INTRAVENOUS | Status: AC | PRN
  Administered 2016-08-08: 20 mL via INTRAVENOUS

## 2016-08-08 NOTE — ED Provider Notes (Addendum)
[facility] Emergency Department Provider Note  ____________________________________________  Time seen: Approximately 2:24 PM  I have reviewed the triage vital signs and the nursing notes.   HISTORY  Chief Complaint Blurred Vision and Headache   HPI Chad Harrington is a 41 y.o. male with h/o paroxysmal SVT, chronic chest pain, and BPH who presents for evaluation of headache and blurry vision. Patient reports that he woke up 3 days ago with an occipital headache that he describes as severe, 10 out of 10, sharp. During the course of the day he started having episodes of dizziness like he was going to pass out, blurry vision, and ringing in his R ear. The headaches currently mild and only 3 out of 10. He presents today because the symptoms are not getting better. He denies any prior history of headaches. He reports that the headache is worse when he turns his head from side to side. He denies slurred speech, facial droop, gait instability, unilateral weakness or numbness of his extremities. Patient has family history of stroke but he has never had one before. No personal or family history of subarachnoid hemorrhage. Not a smoker.   Past Medical History:  Diagnosis Date  . Atrial fibrillation (HCC)   . BPH (benign prostatic hyperplasia)   . Mitral valve prolapse   . Prostate infection 04/2013    Patient Active Problem List   Diagnosis Date Noted  . Visit for well man health check 02/25/2015  . Shortness of breath 02/18/2015  . Paroxysmal supraventricular tachycardia (HCC) 02/18/2015  . Joint swelling, finger, hand 08/27/2014  . Hand pain, left 08/27/2014  . 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 Essential hypertension 04/29/2014  . Polyuria 05/14/2013  . Numbness and tingling of right arm 02/24/2013  . Chest discomfort 02/24/2013  . History of mitral valve prolapse 02/24/2013    Past Surgical History:  Procedure Laterality Date  . KNEE SURGERY       Prior to Admission medications   Medication Sig Start Date End Date Taking? Authorizing Provider  diltiazem (CARDIZEM CD) 120 MG 24 hr capsule Take 1 capsule (120 mg total) by mouth daily. 06/28/16   Antonieta Iba, MD  omeprazole (PRILOSEC) 40 MG capsule Take 1 capsule (40 mg total) by mouth daily. 07/04/14   Dianne Dun, MD  tamsulosin (FLOMAX) 0.4 MG CAPS capsule Take 1 capsule (0.4 mg total) by mouth daily. 06/29/16   Dianne Dun, MD    Allergies Patient has no known allergies.  Family History  Problem Relation Age of Onset  . Diabetes Father   . COPD Father   . Hypertension Father   . Heart disease Father 19    MI  . Heart attack Father   . Cancer Maternal Grandfather     colon CA    Social History Social History  Substance Use Topics  . Smoking status: Never Smoker  . Smokeless tobacco: Never Used  . Alcohol use 0.0 oz/week     Comment: occassionally.    Review of Systems  Constitutional: Negative for fever. Eyes: + blurry vision ENT: Negative for sore throat. Neck: No neck pain  Cardiovascular: Negative for chest pain. Respiratory: Negative for shortness of breath. Gastrointestinal: Negative for abdominal pain, vomiting or diarrhea. Genitourinary: Negative for dysuria. Musculoskeletal: Negative for back pain. Skin: Negative for rash. Neurological: + headaches. No weakness or numbness. Psych: No SI or HI  ____________________________________________   PHYSICAL EXAM:  VITAL SIGNS: ED Triage Vitals  Enc Vitals  Group     BP 08/08/16 1209 126/82     Pulse Rate 08/08/16 1209 79     Resp 08/08/16 1209 20     Temp 08/08/16 1209 98.2 F (36.8 C)     Temp Source 08/08/16 1209 Oral     SpO2 08/08/16 1209 98 %     Weight 08/08/16 1210 220 lb (99.8 kg)     Height 08/08/16 1210 5\' 11"  (1.803 m)     Head Circumference --      Peak Flow --      Pain Score 08/08/16 1215 4     Pain Loc --      Pain Edu? --      Excl. in GC? --     Constitutional:  Alert and oriented. Well appearing and in no apparent distress. HEENT:      Head: Normocephalic and atraumatic.         Eyes: Conjunctivae are normal. Sclera is non-icteric. EOMI. PERRL      Mouth/Throat: Mucous membranes are moist.       Neck: Supple with no signs of meningismus. Cardiovascular: Regular rate and rhythm. No murmurs, gallops, or rubs. 2+ symmetrical distal pulses are present in all extremities. No JVD. Respiratory: Normal respiratory effort. Lungs are clear to auscultation bilaterally. No wheezes, crackles, or rhonchi.  Gastrointestinal: Soft, non tender, and non distended with positive bowel sounds. No rebound or guarding. Musculoskeletal: Nontender with normal range of motion in all extremities. No edema, cyanosis, or erythema of extremities. Neurologic: Normal speech and language. A & O x3, PERRL, full visual fields, no nystagmus, CN II-XII intact, motor testing reveals good tone and bulk throughout. There is no evidence of pronator drift or dysmetria. Muscle strength is 5/5 throughout. Deep tendon reflexes are 2+ throughout with downgoing toes. Sensory examination is intact. Gait is normal. Skin: Skin is warm, dry and intact. No rash noted. Psychiatric: Mood and affect are normal. Speech and behavior are normal.  ____________________________________________   LABS (all labs ordered are listed, but only abnormal results are displayed)  Labs Reviewed  COMPREHENSIVE METABOLIC PANEL - Abnormal; Notable for the following:       Result Value   Glucose, Bld 109 (*)    All other components within normal limits  TROPONIN I  CBC   ____________________________________________  EKG  none ____________________________________________  RADIOLOGY  Head CT: negative  MRI/MRA brain + MRA neck: PND ____________________________________________   PROCEDURES  Procedure(s) performed: None Procedures Critical Care performed:   None ____________________________________________   INITIAL IMPRESSION / ASSESSMENT AND PLAN / ED COURSE  41 y.o. male with h/o paroxysmal SVT, chronic chest pain, and BPH who presents for evaluation of severe headache that started 3 days ago associated with blurry vision, R tinnitus, and lightheadedness. Patient currently has a mild occipital HA, neurologically intact. Vitals WNL. Head CT negative. Since it has been 3 days of symptoms I will pursue MRI brain and MRA neck to rule out SAH, mass, dissection, aneurysm.    ED COURSE:  MRI pending. Care transferred to Dr. Derrill KayGoodman.  Pertinent labs & imaging results that were available during my care of the patient were reviewed by me and considered in my medical decision making (see chart for details).    ____________________________________________   FINAL CLINICAL IMPRESSION(S) / ED DIAGNOSES  Final diagnoses:  Bad headache  Blurry vision, bilateral      NEW MEDICATIONS STARTED DURING THIS VISIT:  Discharge Medication List as of 08/08/2016  5:45 PM  Note:  This document was prepared using Dragon voice recognition software and may include unintentional dictation errors.    Nita Sickle, MD 08/09/16 1127    Nita Sickle, MD 08/09/16 [phone]

## 2016-08-08 NOTE — ED Triage Notes (Signed)
Pt reports since Thursday after waking up pain in back of neck that radiated back of head. Pt c/o increased pain with turning head.  States that since Thursday blurred vision when ever he stands. Also c/o ringing sound in right ear. Denies injuries or falls. Denies use of anticoagulate.

## 2016-08-08 NOTE — ED Notes (Signed)
Pt in MRI.

## 2016-08-08 NOTE — Discharge Instructions (Signed)
As we discussed it is important that you talk to your primary care physician about having your thyroid checked. Please seek medical attention for any high fevers, chest pain, shortness of breath, change in behavior, persistent vomiting, bloody stool or any other new or concerning symptoms.

## 2016-08-08 NOTE — ED Provider Notes (Signed)
-----------------------------------------   5:42 PM on 08/08/2016 -----------------------------------------  MR head IMPRESSION: 1. No acute intracranial abnormality. Normal noncontrast MRI appearance of the brain. 2. Negative intracranial MRA.  MR neck IMPRESSION:  1. Negative neck MRA. Dominant left vertebral artery, the right  terminates in PICA.  2. Left thyroid nodule estimated at 20 mm meets consensus criteria  for nonemergent follow-up Thyroid Ultrasound characterization.     Discussed findings with patient. Discussed importance of following up with PCP about thyroid issue. Also discussed with patient that he should follow up with neurology for these symptoms.    Phineas SemenGraydon Kedra Mcglade, MD 08/08/16 [phone]

## 2016-08-25 ENCOUNTER — Ambulatory Visit (INDEPENDENT_AMBULATORY_CARE_PROVIDER_SITE_OTHER): Payer: BLUE CROSS/BLUE SHIELD | Admitting: Internal Medicine

## 2016-08-25 ENCOUNTER — Encounter: Payer: Self-pay | Admitting: Internal Medicine

## 2016-08-25 ENCOUNTER — Ambulatory Visit (INDEPENDENT_AMBULATORY_CARE_PROVIDER_SITE_OTHER)
Admission: RE | Admit: 2016-08-25 | Discharge: 2016-08-25 | Disposition: A | Discharge status: Home / Self Care | Payer: BLUE CROSS/BLUE SHIELD | Source: Ambulatory Visit | Attending: Internal Medicine | Admitting: Internal Medicine

## 2016-08-25 VITALS — BP 130/70 | HR 123 | Temp 99.4°F | Wt 221.0 lb

## 2016-08-25 DIAGNOSIS — R509 Fever, unspecified: Secondary | ICD-10-CM

## 2016-08-25 DIAGNOSIS — R0602 Shortness of breath: Secondary | ICD-10-CM

## 2016-08-25 DIAGNOSIS — R05 Cough: Secondary | ICD-10-CM | POA: Diagnosis not present

## 2016-08-25 DIAGNOSIS — R0989 Other specified symptoms and signs involving the circulatory and respiratory systems: Secondary | ICD-10-CM | POA: Diagnosis not present

## 2016-08-25 DIAGNOSIS — R059 Cough, unspecified: Secondary | ICD-10-CM

## 2016-08-25 LAB — POC INFLUENZA A&B (BINAX/QUICKVUE)
INFLUENZA A, POC: NEGATIVE
INFLUENZA B, POC: NEGATIVE

## 2016-08-25 MED ORDER — PROMETHAZINE-DM 6.25-15 MG/5ML PO SYRP: 5.0000 mL | ORAL_SOLUTION | Freq: Three times a day (TID) | ORAL | 0 refills | Status: DC | PRN

## 2016-08-25 NOTE — Progress Notes (Signed)
HPI  Pt presents to the clinic today with c/o headache, runny nose, sore throat and cough. This started 3 days ago. He is blowing clear mucous out of his nose. He denies difficulty swallowing. The cough is productive of brown mucous. He is mildly short of breath. He has run fevers up to 101, but denies chills or body aches. He has tried Advil with minimal relief. He reports his wife was diagnosed with bronchitis and pneumonia recently. He did not get his flu shot.   Review of Systems        Past Medical History:  Diagnosis Date  . Atrial fibrillation (HCC)   . BPH (benign prostatic hyperplasia)   . Mitral valve prolapse   . Prostate infection 04/2013    Family History  Problem Relation Age of Onset  . Diabetes Father   . COPD Father   . Hypertension Father   . Heart disease Father [id number]    MI  . Heart attack Father   . Cancer Maternal Grandfather     colon CA    Social History   Social History  . Marital status: Married    Spouse name: N/A  . Number of children: N/A  . Years of education: N/A   Occupational History  . Not on file.   Social History Main Topics  . Smoking status: Never Smoker  . Smokeless tobacco: Never Used  . Alcohol use 0.0 oz/week     Comment: occassionally.  . Drug use: No  . Sexual activity: Not on file   Other Topics Concern  . Not on file   Social History Narrative  . No narrative on file    No Known Allergies   Constitutional: Positive headache, fatigue and fever. Denies abrupt weight changes.  HEENT:  Positive runny nose, sore throat. Denies eye redness, eye pain, pressure behind the eyes, facial pain, nasal congestion, ear pain, ringing in the ears, wax buildup, or bloody nose. Respiratory: Positive cough. Denies difficulty breathing or shortness of breath.  Cardiovascular: Denies chest pain, chest tightness, palpitations or swelling in the hands or feet.   No other specific complaints in a complete review of systems (except as  listed in HPI above).  Objective:   BP 130/70   Pulse (!) 123   Temp 99.4 F (37.4 C) (Oral)   Wt 221 lb (100.2 kg)   SpO2 97%   BMI 30.82 kg/m  Wt Readings from Last 3 Encounters:  08/25/16 221 lb (100.2 kg)  08/08/16 220 lb (99.8 kg)  06/28/16 223 lb 8 oz (101.4 kg)     General: Appears his stated age, in NAD. HEENT: Head: normal shape and size, no sinus tenderness noted; Ears: Tm's gray and intact, normal light reflex; Nose: mucosa pink and moist, septum midline; Throat/Mouth: Teeth present, mucosa pink and moist, no exudate noted, no lesions or ulcerations noted.  Neck: No cervical lymphadenopathy.  Cardiovascular: Tachycardic with normal rhythm. S1,S2 noted.  No murmur, rubs or gallops noted.  Pulmonary/Chest: Normal effort and positive vesicular breath sounds. No respiratory distress. No wheezes, rales or ronchi noted.       Assessment & Plan:   Runny Nose, Cough, SOB and Fever:  Rapid Flu: negative Get some rest and drink plenty of water Do salt water gargles for the sore throat Continue Advil for fever and sore throat Chest xray today to rule out pneumonia RX for Promethazine DM cough syrup  RTC as needed or if symptoms persist.   Nicki ReaperBAITY, Virat Prather, NP

## 2016-08-25 NOTE — Patient Instructions (Signed)
Cough, Adult  A cough helps to clear your throat and lungs. A cough may last only 2?3 weeks (acute), or it may last longer than 8 weeks (chronic). Many different things can cause a cough. A cough may be a sign of an illness or another medical condition.  Follow these instructions at home:  ? Pay attention to any changes in your cough.  ? Take medicines only as told by your doctor.  ? If you were prescribed an antibiotic medicine, take it as told by your doctor. Do not stop taking it even if you start to feel better.  ? Talk with your doctor before you try using a cough medicine.  ? Drink enough fluid to keep your pee (urine) clear or pale yellow.  ? If the air is dry, use a cold steam vaporizer or humidifier in your home.  ? Stay away from things that make you cough at work or at home.  ? If your cough is worse at night, try using extra pillows to raise your head up higher while you sleep.  ? Do not smoke, and try not to be around smoke. If you need help quitting, ask your doctor.  ? Do not have caffeine.  ? Do not drink alcohol.  ? Rest as needed.  Contact a doctor if:  ? You have new problems (symptoms).  ? You cough up yellow fluid (pus).  ? Your cough does not get better after 2?3 weeks, or your cough gets worse.  ? Medicine does not help your cough and you are not sleeping well.  ? You have pain that gets worse or pain that is not helped with medicine.  ? You have a fever.  ? You are losing weight and you do not know why.  ? You have night sweats.  Get help right away if:  ? You cough up blood.  ? You have trouble breathing.  ? Your heartbeat is very fast.  This information is not intended to replace advice given to you by your health care provider. Make sure you discuss any questions you have with your health care provider.  Document Released: 02/18/2011 Document Revised: 11/13/2015 Document Reviewed: 08/14/2014  Elsevier Interactive Patient Education ? 2017 [employer]vier Inc[employer]

## 2016-10-15 ENCOUNTER — Encounter: Payer: Self-pay | Admitting: Family Medicine

## 2016-10-25 ENCOUNTER — Ambulatory Visit: Payer: BLUE CROSS/BLUE SHIELD | Admitting: Family Medicine

## 2016-10-25 ENCOUNTER — Ambulatory Visit (INDEPENDENT_AMBULATORY_CARE_PROVIDER_SITE_OTHER): Payer: BLUE CROSS/BLUE SHIELD | Admitting: Sports Medicine

## 2016-10-25 ENCOUNTER — Encounter: Payer: Self-pay | Admitting: Sports Medicine

## 2016-10-25 ENCOUNTER — Ambulatory Visit: Payer: Self-pay

## 2016-10-25 DIAGNOSIS — M25562 Pain in left knee: Secondary | ICD-10-CM | POA: Diagnosis not present

## 2016-10-25 DIAGNOSIS — G8929 Other chronic pain: Secondary | ICD-10-CM | POA: Diagnosis not present

## 2016-10-25 DIAGNOSIS — M25561 Pain in right knee: Secondary | ICD-10-CM | POA: Diagnosis not present

## 2016-10-25 DIAGNOSIS — M25569 Pain in unspecified knee: Secondary | ICD-10-CM | POA: Insufficient documentation

## 2016-10-25 MED ORDER — DICLOFENAC SODIUM 2 % TD SOLN: 1.0000 "application " | Freq: Two times a day (BID) | TRANSDERMAL | 0 refills | Status: AC

## 2016-10-25 MED ORDER — DICLOFENAC SODIUM 2 % TD SOLN: 1.0000 "application " | Freq: Two times a day (BID) | TRANSDERMAL | 0 refills | Status: DC

## 2016-10-25 MED ORDER — DICLOFENAC SODIUM 2 % TD SOLN: 1.0000 "application " | Freq: Two times a day (BID) | TRANSDERMAL | 2 refills | Status: DC

## 2016-10-25 NOTE — Progress Notes (Signed)
OFFICE VISIT NOTE Chad Harrington. Chad Harrington [facility] [place] [facility] [phone]  Chad Harrington - 41 y.o. male MRN [id number]  Date of birth: [birth year]  Visit Date: 10/25/2016  PCP: Dianne Dun, MD   Referred by: Dianne Dun, MD  Clovis Cao, cma acting as scribe for Dr. Berline Chough.  SUBJECTIVE:   Chief Complaint  Patient presents with  . NP: Bilateral Knee Pain   HPI: As below and per problem based documentation when appropriate.  Chad Harrington reports a chronic history of bilateral knee pain. HX of prior LT knee surgery approx 15 years ago. LT knee bothers him more than the RT knee. He feels the Rt knee bothers him now bc of taking weight off the LT. No recent injury. On 3 separate  occasions had steroid shots with some relief. Describes discomfort as a constant dull tightness with sharp pains. Takes Aleve with minimal relief. Ice and wearing knee braces give him temporary relief. LT Knee xrays completed in 2015.    ROS  Otherwise per HPI. Denies fevers, chills, recent weight gain or weight loss.  No night sweats. No significant nighttime awakenings due to this issue.  HISTORY & PERTINENT PRIOR DATA:  No specialty comments available. He reports that he has never smoked. He has never used smokeless tobacco. No results for input(s): HGBA1C, LABURIC in the last 8760 hours. Medications & Allergies reviewed per EMR Patient Active Problem List   Diagnosis Date Noted  . Knee pain 10/25/2016  . Visit for well man health check 02/25/2015  . Shortness of breath 02/18/2015  . Paroxysmal supraventricular tachycardia (HCC) 02/18/2015  . Joint swelling, finger, hand 08/27/2014  . Hand pain, left 08/27/2014  . 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 Essential hypertension 04/29/2014  . Polyuria 05/14/2013  . Numbness and tingling of right arm 02/24/2013  . Chest discomfort 02/24/2013  . History of mitral valve prolapse 02/24/2013   Past Medical  History:  Diagnosis Date  . Atrial fibrillation (HCC)   . BPH (benign prostatic hyperplasia)   . Mitral valve prolapse   . Prostate infection 04/2013   Family History  Problem Relation Age of Onset  . Diabetes Father   . COPD Father   . Hypertension Father   . Heart disease Father 22    MI  . Heart attack Father   . Cancer Maternal Grandfather     colon CA   Past Surgical History:  Procedure Laterality Date  . KNEE SURGERY     Social History   Occupational History  . Not on file.   Social History Main Topics  . Smoking status: Never Smoker  . Smokeless tobacco: Never Used  . Alcohol use 0.0 oz/week     Comment: occassionally.  . Drug use: No  . Sexual activity: Not on file    OBJECTIVE:  VS:  HT:5\' 11"  (180.3 cm)   WT:219 lb (99.3 kg)  BMI:30.6    BP:120/88  HR:(!) 107bpm  TEMP: ( )  RESP:98 % EXAM: No additional findings.    PROCEDURE NOTE: ULTRASOUND GUIDED LEFT KNEE ASPIRATION & INJECTION  Images were obtained and interpreted by myself, Chad Bidding, DO  Images have been saved and stored to PACS system. Images obtained on: GE S7 Ultrasound machine  DESCRIPTION OF PROCEDURE:  The patient's clinical condition is marked by substantial pain and/or significant functional disability. Other conservative therapy has not provided relief, is contraindicated, or not appropriate. There is a reasonable  likelihood that injection will significantly improve the patient's pain and/or functional impairment. After discussing the risks, benefits and expected outcomes of the injection and all questions were reviewed and answered, the patient wished to undergo the above named procedure. Verbal consent was obtained. The ultrasound was used to identify the target structure and adjacent neurovascular structures. The skin was then prepped in sterile fashion and the target structure was injected under direct visualization using sterile technique as below: PREP: Alcohol, Ethel  Chloride,  APPROACH: Superiolateral, single injection, 25g 1.5" needle INJECTATE: 2 cc 0.5% marcaine, 1 cc 40mg  DepoMedrol ASPIRATE: N/A DRESSING: Band-Aid and Body Helix Compression Sleeve  Post procedural instructions including recommending icing and warning signs for infection were reviewed. This procedure was well tolerated and there were no complications.   IMPRESSION: Succesful US Guided Injection  +++++++++++++++++++++++++++++++++++++++++++++++++++++++++++++++ PROCEDURE NOTE: THERAPEUTIC EXERCISES (97110) 15 minutes spent for Therapeutic exercises as stated in above notes.  This included exercises focusing on stretching, strengthening, with significant focus on eccentric aspects.   Proper technique shown and discussed handout in great detail with ATC.  All questions were discussed and answered.   No results found. ASSESSMENT & PLAN:  Visit Diagnoses:  1. Chronic pain of both knees    Problem List Items Addressed This Visit    Knee pain    Degenerative meniscal tear of the medial meniscus on the left. Right knee has some compensatory pain as well. Pennsaid sample and prescription provided today Body Helix for the left knee provided VMO strengthening and hip exercises reviewed in detail with athletic training staff.      Relevant Orders   US GUIDED NEEDLE PLACEMENT(NO LINKED CHARGES)     Meds:  Meds ordered this encounter  Medications  . DISCONTD: Diclofenac Sodium (PENNSAID) 2 % SOLN    Sig: Place 1 application onto the skin 2 (two) times daily.    Dispense:  8 g    Refill:  0  . DISCONTD: Diclofenac Sodium (PENNSAID) 2 % SOLN    Sig: Place 1 application onto the skin 2 (two) times daily.    Dispense:  112 g    Refill:  2    Home Phone      [phone] Mobile          [phone] Home Phone      [phone]    Follow-up: Return if symptoms worsen or fail to improve.   CMA/ATC served as Neurosurgeonscribe during this visit. History, Physical, and Plan performed by  medical provider. Documentation and orders reviewed and attested to.      Chad BiddingMichael Trinidy Masterson, DO    [facility] Physician    10/25/2016 12:34 PM

## 2016-10-25 NOTE — Patient Instructions (Addendum)
Please perform the exercise program that Chad Harrington has prepared for you and gone over in detail on a daily basis.  In addition to the handout you were provided you can access your program through: [email or web address]   Your unique program code is: [email or web address]  I recommend you obtained a compression sleeve to help with your joint problems. There are many options on the market however I recommend obtaining a FULL KNEE Body Helix compression sleeve.  You can find information (including how to appropriate measure yourself for sizing) can be found at [email or web address].  Many of these products are health savings account (HSA) eligible.   You can use the compression sleeve at any time throughout the day but is most important to use while being active as well as for 2 hours post-activity.   It is appropriate to ice following activity with the compression sleeve in place.

## 2016-10-25 NOTE — Assessment & Plan Note (Signed)
Degenerative meniscal tear of the medial meniscus on the left. Right knee has some compensatory pain as well. Pennsaid sample and prescription provided today Body Helix for the left knee provided VMO strengthening and hip exercises reviewed in detail with athletic training staff.

## 2016-12-15 ENCOUNTER — Encounter: Payer: Self-pay | Admitting: Family Medicine

## 2016-12-15 ENCOUNTER — Ambulatory Visit (INDEPENDENT_AMBULATORY_CARE_PROVIDER_SITE_OTHER): Payer: BLUE CROSS/BLUE SHIELD | Admitting: Family Medicine

## 2016-12-15 VITALS — BP 122/82 | HR 101 | Temp 101.0°F | Resp 12 | Ht 71.0 in | Wt 221.1 lb

## 2016-12-15 DIAGNOSIS — S80862A Insect bite (nonvenomous), left lower leg, initial encounter: Secondary | ICD-10-CM

## 2016-12-15 DIAGNOSIS — R51 Headache: Secondary | ICD-10-CM

## 2016-12-15 DIAGNOSIS — W57XXXA Bitten or stung by nonvenomous insect and other nonvenomous arthropods, initial encounter: Secondary | ICD-10-CM | POA: Diagnosis not present

## 2016-12-15 DIAGNOSIS — R509 Fever, unspecified: Secondary | ICD-10-CM

## 2016-12-15 DIAGNOSIS — R519 Headache, unspecified: Secondary | ICD-10-CM

## 2016-12-15 MED ORDER — DOXYCYCLINE HYCLATE 100 MG PO TABS: 100.0000 mg | ORAL_TABLET | Freq: Two times a day (BID) | ORAL | 0 refills | Status: AC

## 2016-12-15 NOTE — Patient Instructions (Signed)
  Mr.Chad Harrington I have seen you today for an acute visit.  A few things to remember from today's visit:   Fever, unspecified fever cause - Plan: B. [place] Antibodies, CBC with Differential/Platelet, [place] spotted fvr ab, IgG-blood, [place] spotted fvr ab, IgM-blood  Tick bite of left lower leg, initial encounter - Plan: B. [place] Antibodies, CBC with Differential/Platelet, [place] spotted fvr ab, IgG-blood, [place] spotted fvr ab, IgM-blood    If this is viral, is self-limited and symptomatic treatment is recommended. Plenty of fluids and rest.   He is contagious, so you cannot go back to work until 24 hours fever free.     Medications prescribed today are intended for short period of time and will not be refill upon request, a follow up appointment might be necessary to discuss continuation of of treatment if appropriate.     In general please monitor for signs of worsening symptoms and seek immediate medical attention if any concerning.  If symptoms are not resolved in 1-2 weeks you should schedule a follow up appointment with your doctor, before if needed.  Please be sure you have an appointment already scheduled with your PCP before you leave today.

## 2016-12-15 NOTE — Progress Notes (Signed)
HPI:  ACUTE VISIT  Chief Complaint  Patient presents with  . Fever    Chad Harrington is a 41 y.o.male here today with his wife complaining of 3-4 days of "pain all over", headache,and 2-3 days of fever, 101.6 F.  He tells me that he has not tried OTC antipyretics or analgesics because he know "nothing" helps. His wife states that he took Advil 200 mg 3 tabs this morning.   Fever   This is a new problem. The current episode started in the past 7 days. The problem occurs constantly. The problem has been unchanged. The maximum temperature noted was 101 to 101.9 F. The temperature was taken using an oral thermometer. Associated symptoms include coughing (Mild,non productive.), ear pain, headaches and muscle aches. Pertinent negatives include no abdominal pain, chest pain, congestion, diarrhea, nausea, rash, sore throat, urinary pain, vomiting or wheezing. He has tried NSAIDs for the symptoms. The treatment provided no relief.  Risk factors: no contaminated food, no contaminated water, no occupational exposure, no recent sickness, no recent travel and no sick contacts   Risk factors comment:  Tick bite a month ago, LLE  Tick was not engorged but according to wife it was imbedded, not sure for how long tick was attached to area.He has an erythematous,not tender or pruritic lesion, healing slowly but not growing.  2-3 days of fever.  He also c/o SOB, denies chest tightness or wheezing. + Back and cervical pain, right parietal headache, constant, moderate pain. He denies associated visual changes, nausea, vomiting, or mental status changes.   No Hx of recent travel. No sick contact.  Hx of allergies: Denies   Review of Systems  Constitutional: Positive for chills, fatigue and fever.  HENT: Positive for ear pain. Negative for congestion, ear discharge, facial swelling, hearing loss, mouth sores, sore throat and trouble swallowing.   Eyes: Negative for discharge and  redness.  Respiratory: Positive for cough (Mild,non productive.) and shortness of breath. Negative for wheezing.   Cardiovascular: Negative for chest pain.  Gastrointestinal: Negative for abdominal pain, diarrhea, nausea and vomiting.  Genitourinary: Negative for decreased urine volume, dysuria and hematuria.  Musculoskeletal: Positive for arthralgias, back pain, myalgias and neck pain. Negative for gait problem.  Skin: Negative for rash.  Neurological: Positive for headaches. Negative for syncope, facial asymmetry and weakness.  Hematological: Negative for adenopathy. Does not bruise/bleed easily.  Psychiatric/Behavioral: Positive for sleep disturbance. Negative for confusion. The patient is nervous/anxious.     Current Outpatient Prescriptions on File Prior to Visit  Medication Sig Dispense Refill  . Diclofenac Sodium (PENNSAID) 2 % SOLN Place 1 application onto the skin 2 (two) times daily. 112 g 2  . diltiazem (CARDIZEM CD) 120 MG 24 hr capsule Take 1 capsule (120 mg total) by mouth daily. 90 capsule 4  . omeprazole (PRILOSEC) 40 MG capsule Take 1 capsule (40 mg total) by mouth daily. 30 capsule 3  . tamsulosin (FLOMAX) 0.4 MG CAPS capsule Take 1 capsule (0.4 mg total) by mouth daily. 30 capsule 3   No current facility-administered medications on file prior to visit.      Past Medical History:  Diagnosis Date  . Atrial fibrillation (HCC)   . BPH (benign prostatic hyperplasia)   . Mitral valve prolapse   . Prostate infection 04/2013   No Known Allergies  Social History   Social History  . Marital status: Married    Spouse name: N/A  . Number of children: N/A  .  Years of education: N/A   Social History Main Topics  . Smoking status: Never Smoker  . Smokeless tobacco: Never Used  . Alcohol use 0.0 oz/week     Comment: occassionally.  . Drug use: No  . Sexual activity: Not Asked   Other Topics Concern  . None   Social History Narrative  . None    Vitals:    12/15/16 1558  BP: 122/82  Pulse: (!) 101  Resp: 12  Temp: (!) 101 F (38.3 C)  O2 sat at RA 96% Body mass index is 30.84 kg/m.   Physical Exam  Nursing note and vitals reviewed. Constitutional: He is oriented to person, place, and time. He appears well-developed. He does not appear ill. No distress.  HENT:  Head: Atraumatic.  Right Ear: Tympanic membrane, external ear and ear canal normal.  Left Ear: Tympanic membrane, external ear and ear canal normal.  Mouth/Throat: Oropharynx is clear and moist and mucous membranes are normal.  Eyes: Conjunctivae and EOM are normal.  Neck: No Brudzinski's sign and no Kernig's sign noted.  Cardiovascular: Regular rhythm.  Tachycardia present.   No murmur heard. Respiratory: Effort normal and breath sounds normal. No stridor. No respiratory distress.  GI: Soft. He exhibits no mass. There is no hepatomegaly. There is no tenderness.  Musculoskeletal: He exhibits edema (Trace pitting LE edema, bilateral). He exhibits no tenderness.       Cervical back: He exhibits normal range of motion.  Lymphadenopathy:       Head (right side): No submandibular adenopathy present.       Head (left side): No submandibular adenopathy present.    He has no cervical adenopathy.  Neurological: He is alert and oriented to person, place, and time. He has normal strength. Gait normal.  Skin: Skin is warm. Rash noted.     2 mm slightly raised lesion on tick bite site. Mildly erythematous not tender.  Psychiatric: His speech is normal. His mood appears anxious.  Well groomed, good eye contact.     ASSESSMENT AND PLAN:   Chad Harrington was seen today for fever.  Diagnoses and all orders for this visit:  Lab Results  Component Value Date   WBC 4.9 12/15/2016   HGB 15.4 12/15/2016   HCT 45.2 12/15/2016   MCV 91.5 12/15/2016   PLT 212.0 12/15/2016    Fever, unspecified fever cause  We discussed possible etiologies,most likely viral but because flu like symptoms  + hx of tick bite I prefer to cover with oral abx.  Symptomatic treatment with Acetaminophen 500 mg and Ibuprofen, he can alternate. Increase fluid intake. Bed rest. Note for work given. F/U with PCP as needed.   -     doxycycline (VIBRA-TABS) 100 MG tablet; Take 1 tablet (100 mg total) by mouth 2 (two) times daily.  -     CBC with Differential/Platelet -     B. Burgdorfi Antibodies -     Rocky mtn spotted fvr ab, IgG-blood -     Rocky mtn spotted fvr ab, IgM-blood  Tick bite of left lower leg, initial encounter  Skin lesion does not have signs of infection, residual lesion. Further recommendations will be given according to lab results.   -     doxycycline (VIBRA-TABS) 100 MG tablet; Take 1 tablet (100 mg total) by mouth 2 (two) times daily.  -     CBC with Differential/Platelet -     B. Burgdorfi Antibodies -     Rocky mtn spotted fvr  ab, IgG-blood -     Rocky mtn spotted fvr ab, IgM-blood  Headache, unspecified headache type  Neurologic examination today normal. Instructed about warning signs. F/U as needed.  -     B. Burgdorfi Antibodies; Future -     CBC with Differential/Platelet       -Chad Harrington was advised to seek attention immediately if symptoms worsen or to follow if they persist or new concerns arise.       Benjamin Merrihew G. [place], MD  [facility]. [place] office.

## 2016-12-16 ENCOUNTER — Encounter: Payer: Self-pay | Admitting: Family Medicine

## 2016-12-16 LAB — CBC WITH DIFFERENTIAL/PLATELET
BASOS ABS: 0 10*3/uL (ref 0.0–0.1)
Basophils Relative: 0.8 % (ref 0.0–3.0)
EOS ABS: 0 10*3/uL (ref 0.0–0.7)
Eosinophils Relative: 0.2 % (ref 0.0–5.0)
HEMATOCRIT: 45.2 % (ref 39.0–52.0)
HEMOGLOBIN: 15.4 g/dL (ref 13.0–17.0)
LYMPHS PCT: 12.1 % (ref 12.0–46.0)
Lymphs Abs: 0.6 10*3/uL — ABNORMAL LOW (ref 0.7–4.0)
MCHC: 34 g/dL (ref 30.0–36.0)
MCV: 91.5 fl (ref 78.0–100.0)
MONO ABS: 0.5 10*3/uL (ref 0.1–1.0)
Monocytes Relative: 10.3 % (ref 3.0–12.0)
Neutro Abs: 3.7 10*3/uL (ref 1.4–7.7)
Neutrophils Relative %: 76.6 % (ref 43.0–77.0)
Platelets: 212 10*3/uL (ref 150.0–400.0)
RBC: 4.94 Mil/uL (ref 4.22–5.81)
RDW: 14.1 % (ref 11.5–15.5)
WBC: 4.9 10*3/uL (ref 4.0–10.5)

## 2016-12-17 ENCOUNTER — Emergency Department (HOSPITAL_COMMUNITY): Payer: BLUE CROSS/BLUE SHIELD

## 2016-12-17 ENCOUNTER — Telehealth: Payer: Self-pay | Admitting: *Deleted

## 2016-12-17 ENCOUNTER — Emergency Department (HOSPITAL_COMMUNITY)
Admission: EM | Admit: 2016-12-17 | Discharge: 2016-12-18 | Disposition: A | Discharge status: Home / Self Care | Payer: BLUE CROSS/BLUE SHIELD | Attending: Emergency Medicine | Admitting: Emergency Medicine

## 2016-12-17 ENCOUNTER — Encounter (HOSPITAL_COMMUNITY): Payer: Self-pay | Admitting: Emergency Medicine

## 2016-12-17 DIAGNOSIS — R509 Fever, unspecified: Secondary | ICD-10-CM | POA: Diagnosis not present

## 2016-12-17 DIAGNOSIS — Z79899 Other long term (current) drug therapy: Secondary | ICD-10-CM | POA: Diagnosis not present

## 2016-12-17 DIAGNOSIS — I10 Essential (primary) hypertension: Secondary | ICD-10-CM | POA: Insufficient documentation

## 2016-12-17 DIAGNOSIS — I4891 Unspecified atrial fibrillation: Secondary | ICD-10-CM | POA: Insufficient documentation

## 2016-12-17 DIAGNOSIS — R51 Headache: Secondary | ICD-10-CM | POA: Insufficient documentation

## 2016-12-17 DIAGNOSIS — W57XXXD Bitten or stung by nonvenomous insect and other nonvenomous arthropods, subsequent encounter: Secondary | ICD-10-CM

## 2016-12-17 LAB — URINALYSIS, ROUTINE W REFLEX MICROSCOPIC
Bilirubin Urine: NEGATIVE
GLUCOSE, UA: NEGATIVE mg/dL
KETONES UR: NEGATIVE mg/dL
Leukocytes, UA: NEGATIVE
Nitrite: NEGATIVE
PH: 5 (ref 5.0–8.0)
Protein, ur: 30 mg/dL — AB
SPECIFIC GRAVITY, URINE: 1.026 (ref 1.005–1.030)

## 2016-12-17 LAB — COMPREHENSIVE METABOLIC PANEL
ALT: 64 U/L — ABNORMAL HIGH (ref 17–63)
AST: 66 U/L — AB (ref 15–41)
Albumin: 3.4 g/dL — ABNORMAL LOW (ref 3.5–5.0)
Alkaline Phosphatase: 86 U/L (ref 38–126)
Anion gap: 9 (ref 5–15)
BILIRUBIN TOTAL: 0.9 mg/dL (ref 0.3–1.2)
BUN: 10 mg/dL (ref 6–20)
CO2: 21 mmol/L — ABNORMAL LOW (ref 22–32)
CREATININE: 1.13 mg/dL (ref 0.61–1.24)
Calcium: 8.8 mg/dL — ABNORMAL LOW (ref 8.9–10.3)
Chloride: 104 mmol/L (ref 101–111)
GFR calc Af Amer: >60 mL/min (ref >60)
Glucose, Bld: 121 mg/dL — ABNORMAL HIGH (ref 65–99)
Potassium: 4.1 mmol/L (ref 3.5–5.1)
Sodium: 134 mmol/L — ABNORMAL LOW (ref 135–145)
TOTAL PROTEIN: 6.7 g/dL (ref 6.5–8.1)

## 2016-12-17 LAB — CBC WITH DIFFERENTIAL/PLATELET
Basophils Absolute: 0 10*3/uL (ref 0.0–0.1)
Basophils Relative: 0 %
EOS ABS: 0 10*3/uL (ref 0.0–0.7)
Eosinophils Relative: 0 %
HCT: 46.4 % (ref 39.0–52.0)
Hemoglobin: 15.6 g/dL (ref 13.0–17.0)
LYMPHS ABS: 1.3 10*3/uL (ref 0.7–4.0)
Lymphocytes Relative: 12 %
MCH: 30.4 pg (ref 26.0–34.0)
MCHC: 33.6 g/dL (ref 30.0–36.0)
MCV: 90.3 fL (ref 78.0–100.0)
Monocytes Absolute: 1.5 10*3/uL — ABNORMAL HIGH (ref 0.1–1.0)
Monocytes Relative: 13 %
Neutro Abs: 8.3 10*3/uL — ABNORMAL HIGH (ref 1.7–7.7)
Neutrophils Relative %: 75 %
PLATELETS: 176 10*3/uL (ref 150–400)
RBC: 5.14 MIL/uL (ref 4.22–5.81)
RDW: 13.8 % (ref 11.5–15.5)
WBC: 11.1 10*3/uL — AB (ref 4.0–10.5)

## 2016-12-17 LAB — I-STAT CG4 LACTIC ACID, ED: Lactic Acid, Venous: 1 mmol/L (ref 0.5–1.9)

## 2016-12-17 MED ORDER — SODIUM CHLORIDE 0.9 % IV BOLUS (SEPSIS)
1000.0000 mL | Freq: Once | INTRAVENOUS | Status: AC
  Administered 2016-12-18: 1000 mL via INTRAVENOUS

## 2016-12-17 MED ORDER — METOCLOPRAMIDE HCL 5 MG/ML IJ SOLN
10.0000 mg | Freq: Once | INTRAMUSCULAR | Status: AC
Start: 2016-12-17 — End: 2016-12-18
  Administered 2016-12-18: 10 mg via INTRAVENOUS
  Filled 2016-12-17: qty 2

## 2016-12-17 MED ORDER — OXYCODONE-ACETAMINOPHEN 5-325 MG PO TABS
ORAL_TABLET | ORAL | Status: AC
  Administered 2016-12-17: 1
  Filled 2016-12-17: qty 1

## 2016-12-17 MED ORDER — DIPHENHYDRAMINE HCL 50 MG/ML IJ SOLN
25.0000 mg | Freq: Once | INTRAMUSCULAR | Status: AC
  Administered 2016-12-18: 25 mg via INTRAVENOUS
  Filled 2016-12-17: qty 1

## 2016-12-17 NOTE — ED Notes (Signed)
Pt family [facility] requesting another update, apologized once again.

## 2016-12-17 NOTE — Telephone Encounter (Signed)
Patient's spouse called in to report that patient is still having fever; last night temp was 104; patient continues to have fever and chills, headache; has been taking abx and alternating tylenol and ibuprofen; spoke to Dr. [place]Jordan who advises that patient should continue current treatment and if patient has temp of 104 that is not decreased with tylenol or severe headaches to go to ER for eval; advised patient of MD recommendations; understanding voiced.

## 2016-12-17 NOTE — ED Triage Notes (Signed)
Pt to ED with c/o elevated temp, fever and neck pain.  Onset 2 days ago after removing a tick.  Pt is currently taking antibiotic for same but elevated temp continues to increase and headache and neck pain are worse.  Last Advil at 16:00 (800mg )

## 2016-12-17 NOTE — ED Provider Notes (Signed)
[facility]-EMERGENCY DEPT Provider Note   CSN: [id number] Arrival date & time: 12/17/16  1735  By signing my name below, I, Freida Busman, attest that this documentation has been prepared under the direction and in the presence of Alazar Cherian, Jeannett Senior, MD . Electronically Signed: Freida Busman, Scribe. 12/18/2016. 12:35 AM.  History   Chief Complaint Chief Complaint  Patient presents with  . Fever  . Tick Removal   The history is provided by the patient. No language interpreter was used.     HPI Comments:  Chad Harrington is a 41 y.o. male who presents to the Emergency Department complaining of persistent fever x 4 days. He reports associated constant, worsening HA. Pt notes pain to the top of his head and occipital region/upper posterior neck. Pt also notes photophobia. He has taken Tylenol without relief. Pt had a Tick to left calf removed ~ 3 weeks ago and went to his PCP 3 days ago and was tested for lyme disease and rocky mountain spotted fever but has not received the results. He was started on doxycycline which he has been taking for 2 days.No vomiting diarrhea SOB    Past Medical History:  Diagnosis Date  . Atrial fibrillation (HCC)   . BPH (benign prostatic hyperplasia)   . Mitral valve prolapse   . Prostate infection 04/2013    Patient Active Problem List   Diagnosis Date Noted  . Knee pain 10/25/2016  . Visit for well man health check 02/25/2015  . Shortness of breath 02/18/2015  . Paroxysmal supraventricular tachycardia (HCC) 02/18/2015  . Joint swelling, finger, hand 08/27/2014  . Hand pain, left 08/27/2014  . 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 Essential hypertension 04/29/2014  . Polyuria 05/14/2013  . Numbness and tingling of right arm 02/24/2013  . Chest discomfort 02/24/2013  . History of mitral valve prolapse 02/24/2013    Past Surgical History:  Procedure Laterality Date  . KNEE SURGERY         Home Medications    Prior to Admission  medications   Medication Sig Start Date End Date Taking? Authorizing Provider  Diclofenac Sodium (PENNSAID) 2 % SOLN Place 1 application onto the skin 2 (two) times daily. 10/25/16  Yes Andrena Mews, DO  diltiazem (CARDIZEM CD) 120 MG 24 hr capsule Take 1 capsule (120 mg total) by mouth daily. 06/28/16  Yes Gollan, Tollie Pizza, MD  doxycycline (VIBRA-TABS) 100 MG tablet Take 1 tablet (100 mg total) by mouth 2 (two) times daily. 12/15/16 12/25/16 Yes [place], Betty G, MD  tamsulosin (FLOMAX) 0.4 MG CAPS capsule Take 1 capsule (0.4 mg total) by mouth daily. 06/29/16  Yes Dianne Dun, MD  omeprazole (PRILOSEC) 40 MG capsule Take 1 capsule (40 mg total) by mouth daily. Patient not taking: Reported on 12/18/2016 07/04/14   Dianne Dun, MD    Family History Family History  Problem Relation Age of Onset  . Diabetes Father   . COPD Father   . Hypertension Father   . Heart disease Father 11       MI  . Heart attack Father   . Cancer Maternal Grandfather        colon CA    Social History Social History  Substance Use Topics  . Smoking status: Never Smoker  . Smokeless tobacco: Never Used  . Alcohol use 0.0 oz/week     Comment: occassionally.     Allergies   Patient has no known allergies.   Review of Systems Review of  Systems All systems reviewed and are negative for acute change except as noted in the HPI.  Physical Exam Updated Vital Signs BP 133/80   Pulse 93   Temp 100 F (37.8 C) (Oral)   Resp 18   SpO2 96%   Physical Exam  Constitutional: He is oriented to person, place, and time. He appears well-developed and well-nourished. No distress.  Non-toxic appearing  HENT:  Head: Normocephalic and atraumatic.  Mouth/Throat: Oropharynx is clear and moist. No oropharyngeal exudate.  Eyes: Conjunctivae and EOM are normal. Pupils are equal, round, and reactive to light.  Neck: Normal range of motion. Neck supple.  Pain with ROM of neck and neck flexion  Cardiovascular: Normal  rate, regular rhythm, normal heart sounds and intact distal pulses.   No murmur heard. Pulmonary/Chest: Effort normal and breath sounds normal. No respiratory distress.  Abdominal: Soft. There is no tenderness. There is no rebound and no guarding.  Musculoskeletal: Normal range of motion. He exhibits no edema or tenderness.  Pinpoint area of erythema behind left knee  Neurological: He is alert and oriented to person, place, and time. No cranial nerve deficit. He exhibits normal muscle tone. Coordination normal.  No ataxia on finger to nose bilaterally. No pronator drift. 5/5 strength throughout. CN 2-12 intact.Equal grip strength. Sensation intact.   Skin: Skin is warm.  Psychiatric: He has a normal mood and affect. His behavior is normal.  Nursing note and vitals reviewed.    ED Treatments / Results  DIAGNOSTIC STUDIES:  Oxygen Saturation is 96% on RA, normal by my interpretation.    COORDINATION OF CARE:  11:36 PM Discussed treatment plan with pt at bedside which includes lumbar puncture and pt agreed to plan.  Labs (all labs ordered are listed, but only abnormal results are displayed) Labs Reviewed  COMPREHENSIVE METABOLIC PANEL - Abnormal; Notable for the following:       Result Value   Sodium 134 (*)    CO2 21 (*)    Glucose, Bld 121 (*)    Calcium 8.8 (*)    Albumin 3.4 (*)    AST 66 (*)    ALT 64 (*)    All other components within normal limits  CBC WITH DIFFERENTIAL/PLATELET - Abnormal; Notable for the following:    WBC 11.1 (*)    Neutro Abs 8.3 (*)    Monocytes Absolute 1.5 (*)    All other components within normal limits  URINALYSIS, ROUTINE W REFLEX MICROSCOPIC - Abnormal; Notable for the following:    Color, Urine AMBER (*)    APPearance HAZY (*)    Hgb urine dipstick SMALL (*)    Protein, ur 30 (*)    Bacteria, UA RARE (*)    Squamous Epithelial / LPF 0-5 (*)    All other components within normal limits  CSF CULTURE  CSF CELL COUNT WITH DIFFERENTIAL    CSF CELL COUNT WITH DIFFERENTIAL  GLUCOSE, CSF  PROTEIN, CSF  B. BURGDORFI ANTIBODIES, CSF  HERPES SIMPLEX VIRUS(HSV) DNA BY PCR  ROCKY MT SPOTTED FEVER ABS,CSF  I-STAT CG4 LACTIC ACID, ED    EKG  EKG Interpretation None       Radiology Ct Head Wo Contrast  Result Date: 12/18/2016 CLINICAL DATA:  41 year old male with headache and fever for 4 days. EXAM: CT HEAD WITHOUT CONTRAST TECHNIQUE: Contiguous axial images were obtained from the base of the skull through the vertex without intravenous contrast. COMPARISON:  08/08/2016 MR and CT FINDINGS: Brain: No evidence of infarction,  hemorrhage, hydrocephalus, extra-axial collection or mass lesion/mass effect. Vascular: No hyperdense vessel or unexpected calcification. Skull: Normal. Negative for fracture or focal lesion. Sinuses/Orbits: No acute finding. Other: None. IMPRESSION: Unremarkable noncontrast head CT. Electronically Signed   By: Harmon Pier M.D.   On: 12/18/2016 00:00    Procedures .Lumbar Puncture Date/Time: 12/17/2016 11:33 PM Performed by: Glynn Octave Authorized by: Glynn Octave   Consent:    Consent obtained:  Verbal and written   Consent given by:  Patient   Risks discussed:  Headache, bleeding, infection and nerve damage (worsening) Pre-procedure details:    Procedure purpose:  Diagnostic   Preparation: Patient was prepped and draped in usual sterile fashion   Anesthesia (see MAR for exact dosages):    Anesthesia method:  Local infiltration   Local anesthetic:  Lidocaine 1% w/o epi Procedure details:    Lumbar space:  L4-L5 interspace   Patient position:  Sitting   Needle gauge:  20   Needle type:  Spinal needle - Quincke tip   Needle length (in):  2.5   Ultrasound guidance: no     Number of attempts:  2   Fluid appearance:  Clear   Tubes of fluid:  4   Total volume (ml):  4 Post-procedure:    Puncture site:  Adhesive bandage applied   Patient tolerance of procedure:  Tolerated well, no  immediate complications Comments:     Burna Forts PA-C assisted.       (including critical care time)  Medications Ordered in ED Medications  sodium chloride 0.9 % bolus 1,000 mL (1,000 mLs Intravenous New Bag/Given 12/18/16 0006)  lidocaine (PF) (XYLOCAINE) 1 % injection (not administered)  oxyCODONE-acetaminophen (PERCOCET/ROXICET) 5-325 MG per tablet (1 tablet  Given 12/17/16 2210)  metoCLOPramide (REGLAN) injection 10 mg (10 mg Intravenous Given 12/18/16 0006)  diphenhydrAMINE (BENADRYL) injection 25 mg (25 mg Intravenous Given 12/18/16 0006)     Initial Impression / Assessment and Plan / ED Course  I have reviewed the triage vital signs and the nursing notes.  Pertinent labs & imaging results that were available during my care of the patient were reviewed by me and considered in my medical decision making (see chart for details).     Patient presents with 3 days persistent headache, fevers and neck pain. Started on doxycycline by his PCP 2 days ago after having tick bite several weeks ago. Reports persistent headache with fever and photophobia. No focal neurological deficits.  Patient is not toxic. Does have pain with range of motion of neck and neck flexion.  CT negative.  LP performed with ongoing headache, fever, and neck pain.  CSF results not consistent with meningitis.  RMSF and lyme blood titers (PCP) and CSF titers pending.  3:50 AM Pt updated with results; states he feels better at this time.   Patient states headache improved with reglan, benadryl, fluids.  He is tolerating PO and ambulatory.  Continue doxycycline for possible tick borne febrile illness.  COuld also be viral.  Followup with PCP.  Return precautions discussed.  Final Clinical Impressions(s) / ED Diagnoses   Final diagnoses:  Fever, unspecified fever cause  Tick bite, subsequent encounter    New Prescriptions New Prescriptions   No medications on file   I personally performed the services  described in this documentation, which was scribed in my presence. The recorded information has been reviewed and is accurate.     Glynn Octave, MD 12/18/16 [phone]

## 2016-12-17 NOTE — ED Notes (Signed)
Offered a percocet for pain/fever while pt is waiting for room. Pt and family very appreciative.

## 2016-12-17 NOTE — ED Notes (Signed)
Pt [facility] at [facility] requesting update, apologized for delay. Pt wife very understanding

## 2016-12-18 ENCOUNTER — Encounter: Payer: Self-pay | Admitting: Family Medicine

## 2016-12-18 LAB — CSF CELL COUNT WITH DIFFERENTIAL
RBC COUNT CSF: 0 /mm3
RBC Count, CSF: 0 /mm3
TUBE #: 4
Tube #: 1
WBC CSF: 1 /mm3 (ref 0–5)
WBC CSF: 2 /mm3 (ref 0–5)

## 2016-12-18 LAB — GLUCOSE, CSF: GLUCOSE CSF: 70 mg/dL (ref 40–70)

## 2016-12-18 LAB — PROTEIN, CSF: Total  Protein, CSF: 19 mg/dL (ref 15–45)

## 2016-12-18 MED ORDER — LIDOCAINE HCL (PF) 1 % IJ SOLN
INTRAMUSCULAR | Status: AC
  Filled 2016-12-18: qty 30

## 2016-12-18 NOTE — Discharge Instructions (Signed)
There is no evidence of meningitis. Continue the doxycycline as prescribed. Follow-up with your doctor. Return to the ED ifdevelop new or worsening symptoms.

## 2016-12-21 LAB — B. BURGDORFI ANTIBODIES: Lyme IgG/IgM Ab: <0.91 {ISR} (ref 0.00–0.90)

## 2016-12-21 LAB — ROCKY MTN SPOTTED FVR AB, IGG-BLOOD: RMSF IGG: POSITIVE — AB

## 2016-12-21 LAB — ROCKY MTN SPOTTED FVR AB, IGM-BLOOD: RMSF IgM: 0.24 index (ref 0.00–0.89)

## 2016-12-21 LAB — RMSF, IGG, IFA: RMSF, IGG, IFA: 1:256 {titer} — ABNORMAL HIGH

## 2016-12-21 LAB — HERPES SIMPLEX VIRUS(HSV) DNA BY PCR
HSV 1 DNA: NEGATIVE
HSV 2 DNA: NEGATIVE

## 2016-12-21 LAB — CSF CULTURE W GRAM STAIN: Culture: NO GROWTH

## 2016-12-21 LAB — CSF CULTURE: GRAM STAIN: NONE SEEN

## 2016-12-24 LAB — ROCKY MT SPOTTED FEVER ABS,CSF

## 2016-12-25 ENCOUNTER — Encounter: Payer: Self-pay | Admitting: Family Medicine

## 2016-12-27 ENCOUNTER — Ambulatory Visit: Payer: Self-pay | Admitting: Family Medicine

## 2016-12-27 ENCOUNTER — Encounter: Payer: Self-pay | Admitting: Family Medicine

## 2016-12-28 ENCOUNTER — Encounter: Payer: Self-pay | Admitting: Family Medicine

## 2016-12-28 ENCOUNTER — Ambulatory Visit (INDEPENDENT_AMBULATORY_CARE_PROVIDER_SITE_OTHER): Payer: BLUE CROSS/BLUE SHIELD | Admitting: Family Medicine

## 2016-12-28 DIAGNOSIS — N529 Male erectile dysfunction, unspecified: Secondary | ICD-10-CM | POA: Diagnosis not present

## 2016-12-28 LAB — B. BURGDORFI ANTIBODIES, CSF

## 2016-12-28 LAB — TSH: TSH: 1.31 u[IU]/mL (ref 0.35–4.50)

## 2016-12-28 NOTE — Progress Notes (Signed)
Subjective:   Patient ID: Chad Harrington, male    DOB: 03-29-1976, 41 y.o.   MRN: [id number]  Chad Harrington is a pleasant 40 y.o. year old male who presents to clinic today with Erectile Dysfunction  on 12/28/2016  HPI:  Erectile dysfunction- past month and a half, he can get excited but not achieve an erection, whether while masterbating or with his wife. He feels his relationship is good.  In the past, urologist gave him flomax and his symptoms improved.  They have not improved this time.  Current Outpatient Prescriptions on File Prior to Visit  Medication Sig Dispense Refill  . Diclofenac Sodium (PENNSAID) 2 % SOLN Place 1 application onto the skin 2 (two) times daily. 112 g 2  . diltiazem (CARDIZEM CD) 120 MG 24 hr capsule Take 1 capsule (120 mg total) by mouth daily. 90 capsule 4  . omeprazole (PRILOSEC) 40 MG capsule Take 1 capsule (40 mg total) by mouth daily. 30 capsule 3  . tamsulosin (FLOMAX) 0.4 MG CAPS capsule Take 1 capsule (0.4 mg total) by mouth daily. 30 capsule 3   No current facility-administered medications on file prior to visit.     No Known Allergies  Past Medical History:  Diagnosis Date  . Atrial fibrillation (HCC)   . BPH (benign prostatic hyperplasia)   . Mitral valve prolapse   . Prostate infection 04/2013    Past Surgical History:  Procedure Laterality Date  . KNEE SURGERY      Family History  Problem Relation Age of Onset  . Diabetes Father   . COPD Father   . Hypertension Father   . Heart disease Father 25       MI  . Heart attack Father   . Cancer Maternal Grandfather        colon CA    Social History   Social History  . Marital status: Married    Spouse name: N/A  . Number of children: N/A  . Years of education: N/A   Occupational History  . Not on file.   Social History Main Topics  . Smoking status: Never Smoker  . Smokeless tobacco: Never Used  . Alcohol use 0.0 oz/week     Comment: occassionally.  .  Drug use: No  . Sexual activity: Not on file   Other Topics Concern  . Not on file   Social History Narrative  . No narrative on file   The PMH, PSH, Social History, Family History, Medications, and allergies have been reviewed in [facility], and have been updated if relevant.   Review of Systems  Constitutional: Positive for fatigue.  Genitourinary: Negative for decreased urine volume, difficulty urinating, discharge, dysuria, enuresis, flank pain, frequency, genital sores, hematuria, penile pain, penile swelling, scrotal swelling, testicular pain and urgency.  Psychiatric/Behavioral: Negative.   All other systems reviewed and are negative.      Objective:    BP 110/78   Pulse 68   Ht 5\' 11"  (1.803 m)   Wt 212 lb (96.2 kg)   SpO2 97%   BMI 29.57 kg/m    Physical Exam  Constitutional: He is oriented to person, place, and time. He appears well-developed and well-nourished. No distress.  HENT:  Head: Normocephalic and atraumatic.  Eyes: Conjunctivae are normal.  Cardiovascular: Normal rate.   Pulmonary/Chest: Effort normal.  Musculoskeletal: Normal range of motion.  Neurological: He is alert and oriented to person, place, and time. No cranial nerve deficit.  Skin: Skin is  warm and dry. He is not diaphoretic.  Psychiatric: He has a normal mood and affect. His behavior is normal. Judgment and thought content normal.  Nursing note and vitals reviewed.         Assessment & Plan:   Erectile dysfunction, unspecified erectile dysfunction type - Plan: TSH, Testos,Total,Free and SHBG (Male), CANCELED: Testosterone, CANCELED: Testosterone Total,Free,Bio, Males No Follow-up on file.

## 2016-12-28 NOTE — Assessment & Plan Note (Signed)
Given his age, will check labs today- rule out organic cause such as low testosterone or thyroid dysfunction. The patient indicates understanding of these issues and agrees with the plan.

## 2016-12-28 NOTE — Patient Instructions (Signed)
Great to see you. I will call you with your lab results.   

## 2017-01-01 LAB — TESTOS,TOTAL,FREE AND SHBG (FEMALE)
SEX HORMONE BINDING GLOB.: 30 nmol/L (ref 10–50)
TESTOSTERONE,FREE: 78.4 pg/mL (ref 35.0–155.0)
Testosterone,Total,LC/MS/MS: 433 ng/dL (ref 250–1100)

## 2017-01-27 ENCOUNTER — Other Ambulatory Visit: Payer: Self-pay | Admitting: Family Medicine

## 2017-04-29 ENCOUNTER — Other Ambulatory Visit: Payer: Self-pay

## 2017-04-29 MED ORDER — TAMSULOSIN HCL 0.4 MG PO CAPS: 0.4000 mg | ORAL_CAPSULE | Freq: Every day | ORAL | 0 refills | Status: DC

## 2017-04-29 NOTE — Progress Notes (Signed)
Faxed in 30d of Tamsulosin with note to have pt sched appt for future fills/he was supposed to reach out in July after he reviewed lab results to advise if he was in agreement with referral to Urology but did not/thx dmf

## 2017-07-11 ENCOUNTER — Ambulatory Visit (INDEPENDENT_AMBULATORY_CARE_PROVIDER_SITE_OTHER): Payer: BLUE CROSS/BLUE SHIELD | Admitting: Family Medicine

## 2017-07-11 ENCOUNTER — Encounter: Payer: Self-pay | Admitting: Family Medicine

## 2017-07-11 ENCOUNTER — Other Ambulatory Visit: Payer: Self-pay

## 2017-07-11 VITALS — BP 140/86 | HR 72 | Temp 98.5°F | Ht 71.0 in | Wt 226.8 lb

## 2017-07-11 DIAGNOSIS — M25561 Pain in right knee: Secondary | ICD-10-CM

## 2017-07-11 DIAGNOSIS — M1710 Unilateral primary osteoarthritis, unspecified knee: Secondary | ICD-10-CM

## 2017-07-11 DIAGNOSIS — G8929 Other chronic pain: Secondary | ICD-10-CM

## 2017-07-11 DIAGNOSIS — M25562 Pain in left knee: Secondary | ICD-10-CM

## 2017-07-11 MED ORDER — DICLOFENAC SODIUM 1.5 % TD SOLN: TRANSDERMAL | 2 refills | Status: DC

## 2017-07-11 MED ORDER — METHYLPREDNISOLONE ACETATE 40 MG/ML IJ SUSP
80.0000 mg | Freq: Once | INTRAMUSCULAR | Status: AC
  Administered 2017-07-11: 80 mg via INTRA_ARTICULAR

## 2017-07-11 MED ORDER — DICLOFENAC SODIUM 2 % TD SOLN: 1.0000 "application " | Freq: Two times a day (BID) | TRANSDERMAL | 2 refills | Status: DC

## 2017-07-11 NOTE — Progress Notes (Signed)
Dr. Karleen Hampshire T. Baylee Mccorkel, MD, CAQ Sports Medicine [facility] [address] [place], [address] Phone: [phone] Fax: [phone]  07/11/2017  Patient: Chad Harrington, MRN: [id number], DOB: 1975-10-08, 42 y.o.  Primary Physician:  Dianne Dun, MD   Chief Complaint  Patient presents with  . Knee Pain    Bilateral   Subjective:   Chad Harrington is a 42 y.o. very pleasant male patient who presents with the following:  Knee problems and pain, now R knee: Patient has chronic pain of both knees, and I recall seeing him back in September 2015.  He has a significant history for having knee pain particular on the left side for greater than a decade, and he had a initial knee injury in 2000.  At that point he had an arthroscopic partial meniscectomy done by Dr. Luiz Blare, the patient reports that more than 25% of his meniscus was removed.  He has been having some intermittent pain off and on, he works as a Curator.  I did inject his left knee little bit over 3 years ago and he had relief of symptoms for about 2-1/2-3 years.  He did see my partner over the summer when I was unavailable, and had a ultrasound-guided knee injection, which the patient felt exacerbated his symptoms.  Now he is having pain in his right knee as well, and has been walking with a mild limp.  B knee injections  Past Medical History, Surgical History, Social History, Family History, Problem List, Medications, and Allergies have been reviewed and updated if relevant.  Patient Active Problem List   Diagnosis Date Noted  . Erectile dysfunction 12/28/2016  . Knee pain 10/25/2016  . Visit for well man health check 02/25/2015  . Shortness of breath 02/18/2015  . Paroxysmal supraventricular tachycardia (HCC) 02/18/2015  . Joint swelling, finger, hand 08/27/2014  . Hand pain, left 08/27/2014  . 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 Essential hypertension 04/29/2014  . Polyuria  05/14/2013  . Numbness and tingling of right arm 02/24/2013  . History of mitral valve prolapse 02/24/2013    Past Medical History:  Diagnosis Date  . Atrial fibrillation (HCC)   . BPH (benign prostatic hyperplasia)   . Mitral valve prolapse   . Prostate infection 04/2013    Past Surgical History:  Procedure Laterality Date  . KNEE SURGERY      Social History   Socioeconomic History  . Marital status: Married    Spouse name: Not on file  . Number of children: Not on file  . Years of education: Not on file  . Highest education level: Not on file  Social Needs  . Financial resource strain: Not on file  . Food insecurity - worry: Not on file  . Food insecurity - inability: Not on file  . Transportation needs - medical: Not on file  . Transportation needs - non-medical: Not on file  Occupational History  . Not on file  Tobacco Use  . Smoking status: Never Smoker  . Smokeless tobacco: Never Used  Substance and Sexual Activity  . Alcohol use: Yes    Alcohol/week: 0.0 oz    Comment: occassionally.  . Drug use: No  . Sexual activity: Not on file  Other Topics Concern  . Not on file  Social History Narrative  . Not on file    Family History  Problem Relation Age of Onset  . Diabetes Father   . COPD Father   .  Hypertension Father   . Heart disease Father 46       MI  . Heart attack Father   . Cancer Maternal Grandfather        colon CA    No Known Allergies  Medication list reviewed and updated in full in [facility].  GEN: No fevers, chills. Nontoxic. Primarily MSK c/o today. MSK: Detailed in the HPI GI: tolerating PO intake without difficulty Neuro: No numbness, parasthesias, or tingling associated. Otherwise the pertinent positives of the ROS are noted above.   Objective:   BP 140/86   Pulse 72   Temp 98.5 F (36.9 C) (Oral)   Ht 5\' 11"  (1.803 m)   Wt 226 lb 12 oz (102.9 kg)   BMI 31.63 kg/m    GEN: WDWN, NAD, Non-toxic, Alert &  Oriented x 3 HEENT: Atraumatic, Normocephalic.  Ears and Nose: No external deformity. EXTR: No clubbing/cyanosis/edema NEURO: Normal gait.  PSYCH: Normally interactive. Conversant. Not depressed or anxious appearing.  Calm demeanor.    Bilateral knees: Full extension.  Flexion to 120 on the left and 127 on the right.  No significant patellar facet irritation.  There is notable medial joint line tenderness on the left side, more so compared to the right knee.  Stable ACL, PCL, MCL, and LCL.  McMurray's and flexion pinch saw cause some mild pain.  Radiology: No results found.  Assessment and Plan:   Chronic pain of both knees - Plan: methylPREDNISolone acetate (Chad Harrington) injection 80 mg, methylPREDNISolone acetate (Chad Harrington) injection 80 mg  Osteoarthritis, localized, knee  Significant prior injuries in the left knee almost 20 years ago certainly play a significant role here with some posttraumatic arthritis and prior large partial meniscectomy.  Continue to work on strength, weight control, good healthy living, I will refill the patient's pennsaid, and we will do the injections to try to calm him down acutely.  Knee Injection, RIGHT Patient verbally consented to procedure. Risks (including potential rare risk of infection), benefits, and alternatives explained. Sterilely prepped with Chloraprep. Ethyl cholride used for anesthesia. 8 cc Lidocaine 1% mixed with 2 mL Chad Harrington 40 mg injected using the anteromedial approach without difficulty. No complications with procedure and tolerated well. Patient had decreased pain post-injection.   Knee Injection, LEFT Patient verbally consented to procedure. Risks (including potential rare risk of infection), benefits, and alternatives explained. Sterilely prepped with Chloraprep. Ethyl cholride used for anesthesia. 8 cc Lidocaine 1% mixed with 2 mL Chad Harrington 40 mg injected using the anteromedial approach without difficulty. No complications with  procedure and tolerated well. Patient had decreased pain post-injection.     Follow-up: prn  Meds ordered this encounter  Medications  . DISCONTD: Diclofenac Sodium (PENNSAID) 2 % SOLN    Sig: Place 1 application onto the skin 2 (two) times daily.    Dispense:  112 g    Refill:  2    Home Phone      [phone] Mobile          [phone] Home Phone      [phone]   . methylPREDNISolone acetate (Chad Harrington) injection 80 mg  . methylPREDNISolone acetate (Chad Harrington) injection 80 mg  . Diclofenac Sodium 1.5 % SOLN    Sig: Place one application to skin two times a day    Dispense:  150 mL    Refill:  2   Signed,  Luisdavid Hamblin T. Delphia Kaylor, MD   Allergies as of 07/11/2017   No Known Allergies     Medication List  Accurate as of 07/11/17 11:59 PM. Always use your most recent med list.          Diclofenac Sodium 1.5 % Soln Place one application to skin two times a day   diltiazem 120 MG 24 hr capsule Commonly known as:  CARDIZEM CD Take 1 capsule (120 mg total) by mouth daily.   omeprazole 40 MG capsule Commonly known as:  PRILOSEC Take 1 capsule (40 mg total) by mouth daily.   tamsulosin 0.4 MG Caps capsule Commonly known as:  FLOMAX Take 1 capsule (0.4 mg total) daily by mouth.

## 2017-07-12 ENCOUNTER — Telehealth: Payer: Self-pay | Admitting: *Deleted

## 2017-07-12 ENCOUNTER — Encounter: Payer: Self-pay | Admitting: Family Medicine

## 2017-07-12 NOTE — Telephone Encounter (Signed)
[facility] the Diclofenac 2% Solution is not covered under patient's insurance and would cost him >$2700.  They ask that we send in something cheaper.  I sent in Diclofenac1.5% solution which stated was a Tier 2.  Received fax today stating not covered and would cost $250.23.  Again they ask that we send in something that is covered.  I faxed the request back to [facility] advising them to have patient call his insurance company to find out what they will cover and to let us know.

## 2017-07-13 MED ORDER — DICLOFENAC SODIUM 2 % TD SOLN: TRANSDERMAL | 5 refills | Status: DC

## 2017-07-13 NOTE — Telephone Encounter (Signed)
Mr. Chad Harrington ask that I send in Rx for the Pennsaid 2% solution to [facility] [place] [facility] [place]  Sent as requested.  Patient notified Rx has been sent via MyChart.

## 2017-07-13 NOTE — Telephone Encounter (Signed)
I do not know what his insurance covers - perhaps pharmacy can help?

## 2017-07-23 DIAGNOSIS — R079 Chest pain, unspecified: Secondary | ICD-10-CM | POA: Insufficient documentation

## 2017-07-23 NOTE — Progress Notes (Signed)
Cardiology Office Note  Date:  07/26/2017   ID:  Bunyan, Brier 1976-04-14, MRN [id number]  PCP:  Dianne Dun, MD   Chief Complaint  Patient presents with  . Other     OD 6 month follow up. Patient c/o SOB and vibration in left legs. Patient last seen 06/28/16. Meds reviewed verbally with patient.     HPI:  Mr. Dawes is a very pleasant 42 year old gentleman history of  chronic chest pain dating back to age 53,  hypertension,  Father,  3 girls all teenagers who presents for routine follow-up for his chest pain.   Follow-up today he denies any significant chest pain No significant shortness of breath Previously wife reports that he has significant snoring Denies apnea  Blood pressure has been well-controlled on his diltiazem 120  mg daily Does not feel that he needs a higher dose pill Previous had diltiazem 30 mg pills, did not use this for breakthrough  He is never smoked No diabetes,  Previously reported having some difficulty swallowing Requesting refill of his Flomax until he is able to see primary care  EKG on today's visit shows normal sinus rhythm with rate 87  bpm, no significant ST or T-wave changes  Family history father passed away from CHF, MI, COPD Brother also with sleep apnea, COPD Both his brother and father were smokers  Other past medical history Previously he has had numerous trips to the emergency room, workup in the past. He has been treated with various medications including atenolol, Toprol with no improvement of his symptoms.  visits to the emergency room did not yield a reason for his chest pain. Felt to have GERD. Never tried proton pump inhibitors. Told to try H2 blockers such as Zantac. He has tried NSAIDs before with no relief of his symptoms.   Prior treadmill stress test showing no ischemia on EKG  Reports that he was diagnosed with mitral valve prolapse at age 72.   Also had episodes of sweating, tachycardia, weakness  and dizziness at the end of 2013. No recent episodes   In terms of his family history, brother had CAD in his late 4s. He is a smoker father also has history of CAD, stroke, CHF in his 55s also a smoker   Terms of his social history,  reports that he has never smoked. He has never used smokeless tobacco. He reports that he drinks alcohol. He reports that he does not use illicit drugs.  PMH:   has a past medical history of Atrial fibrillation (HCC), BPH (benign prostatic hyperplasia), Mitral valve prolapse, and Prostate infection (04/2013).  PSH:    Past Surgical History:  Procedure Laterality Date  . KNEE SURGERY      Current Outpatient Medications  Medication Sig Dispense Refill  . Diclofenac Sodium (PENNSAID) 2 % SOLN Apply 1 pump (20 mg) Topically to affected area twice a day 112 g 5  . diltiazem (CARDIZEM CD) 120 MG 24 hr capsule Take 1 capsule (120 mg total) by mouth daily. 90 capsule 4  . omeprazole (PRILOSEC) 40 MG capsule Take 1 capsule (40 mg total) by mouth daily. 30 capsule 3  . tamsulosin (FLOMAX) 0.4 MG CAPS capsule Take 1 capsule (0.4 mg total) daily by mouth. 30 capsule 0   No current facility-administered medications for this visit.      Allergies:   Patient has no known allergies.   Social History:  The patient  reports that  has never smoked. he has  never used smokeless tobacco. He reports that he drinks alcohol. He reports that he does not use drugs.   Family History:   family history includes COPD in his father; Cancer in his maternal grandfather; Diabetes in his father; Heart attack in his father; Heart disease (age of onset: 10375) in his father; Hypertension in his father.    Review of Systems: Review of Systems  Constitutional: Negative.   Respiratory: Negative.   Cardiovascular: Negative.   Gastrointestinal: Negative.   Musculoskeletal: Negative.   Neurological: Negative.   Psychiatric/Behavioral: Negative.   All other systems reviewed and are  negative.    PHYSICAL EXAM: VS:  BP 126/82 (BP Location: Left Arm, Patient Position: Sitting, Cuff Size: Normal)   Pulse 87   Ht 5\' 11"  (1.803 m)   Wt 227 lb (103 kg)   BMI 31.66 kg/m  , BMI Body mass index is 31.66 kg/m. GEN: Well nourished, well developed, in no acute distress  HEENT: normal  Neck: no JVD, carotid bruits, or masses Cardiac: RRR; no murmurs, rubs, or gallops,no edema  Respiratory:  clear to auscultation bilaterally, normal work of breathing GI: soft, nontender, nondistended, + BS MS: no deformity or atrophy  Skin: warm and dry, no rash Neuro:  Strength and sensation are intact Psych: euthymic mood, full affect    Recent Labs: 12/17/2016: ALT 64; BUN 10; Creatinine, Ser 1.13; Hemoglobin 15.6; Platelets 176; Potassium 4.1; Sodium 134 12/28/2016: TSH 1.31    Lipid Panel Lab Results  Component Value Date   CHOL 171 02/25/2015   HDL 38.30 (L) 02/25/2015   LDLCALC 113 (H) 02/25/2015   TRIG 97.0 02/25/2015      Wt Readings from Last 3 Encounters:  07/26/17 227 lb (103 kg)  07/11/17 226 lb 12 oz (102.9 kg)  12/28/16 212 lb (96.2 kg)       ASSESSMENT AND PLAN:  Essential hypertension - Plan: EKG 12-Lead Blood pressure is well controlled on today's visit. No changes made to the medications. Refilled diltiazem 120 Discussed higher doses but feels he is doing well on current dose  Chest discomfort Long history of chronic chest pain, none recently Improved with diltiazem, rate control  Shortness of breath Previous symptoms, seem to have resolved No further workup at this time  BPH Requesting refill of his Flomax Prescription sent in, he will follow-up with new primary care  Paroxysmal supraventricular tachycardia (HCC) Denies any tachycardia concerning for arrhythmia Continue diltiazem   Total encounter time more than 25 minutes  Greater than 50% was spent in counseling and coordination of care with the patient   Disposition:   F/U  as  needed   Orders Placed This Encounter  Procedures  . EKG 12-Lead     Signed, Dossie Arbourim Dossie Ocanas, M.D., Ph.D. 07/26/2017  [facility], [place] [phone]

## 2017-07-25 IMAGING — CT CT HEAD W/O CM
4 series · 16 of 47 positions shown, 18 images · non-contrast
Comparison: 08/08/2016 MR and CT

CLINICAL DATA: 40-year-old male with headache and fever for 4 days.

EXAM:
CT HEAD WITHOUT CONTRAST
TECHNIQUE: Contiguous axial images were obtained from the base of the skull
through the vertex without intravenous contrast.

[Series 3: head wo · axial · 0.44mm/px · z∈[-42,+62]mm · 7 of 29 slices shown, 9 images]
[im 4/29  brain]
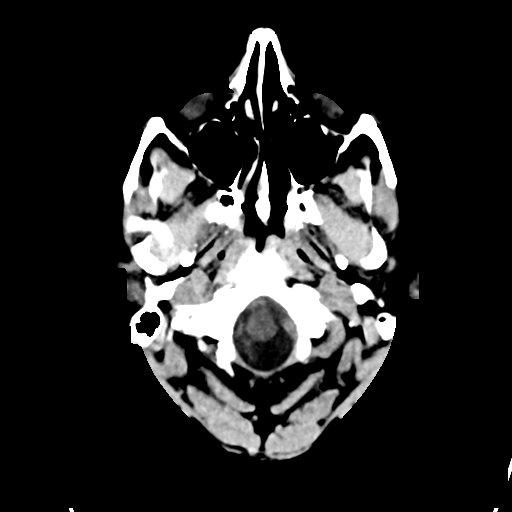
[im 4/29  bone]
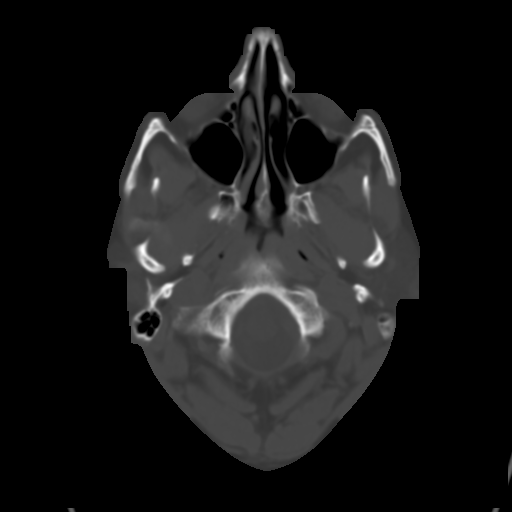
[im 8/29  brain]
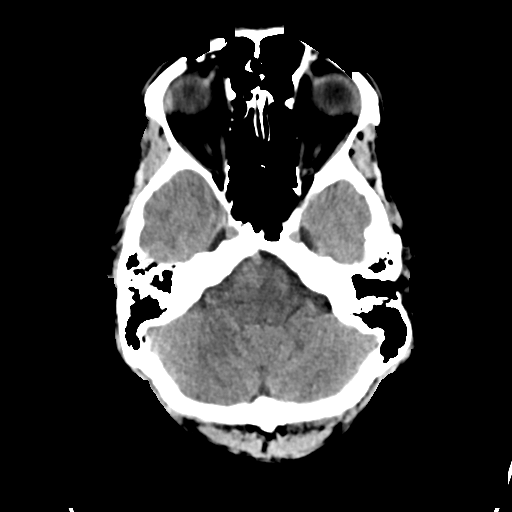
[im 11/29  brain]
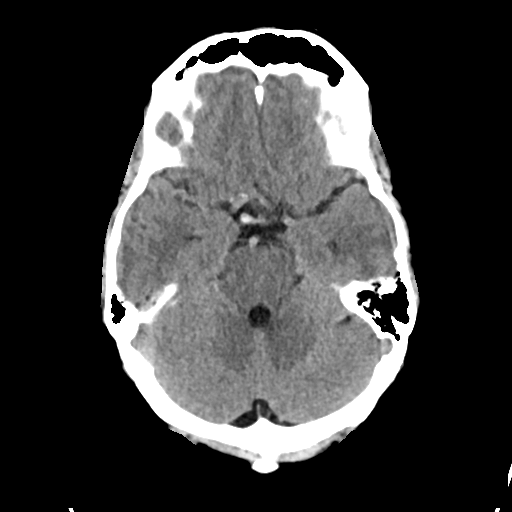
[im 15/29  brain]
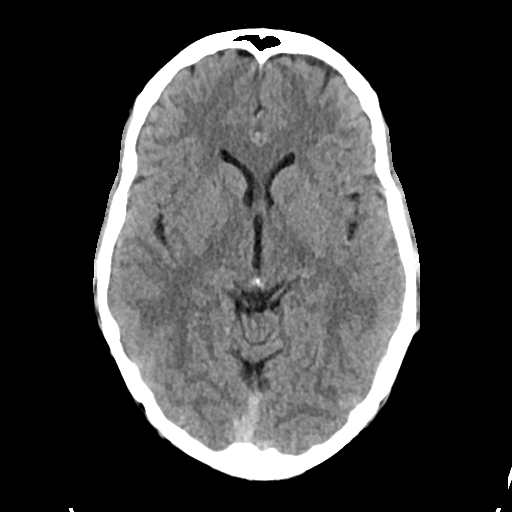
[im 18/29  brain]
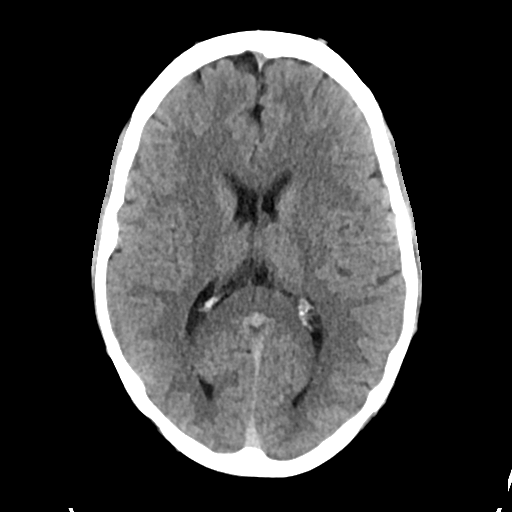
[im 18/29  bone]
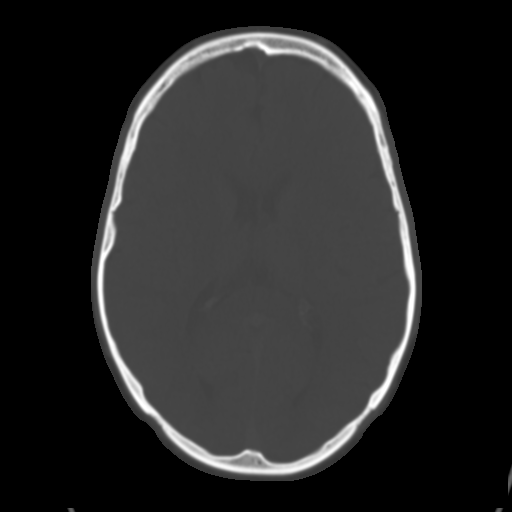
[im 22/29  brain]
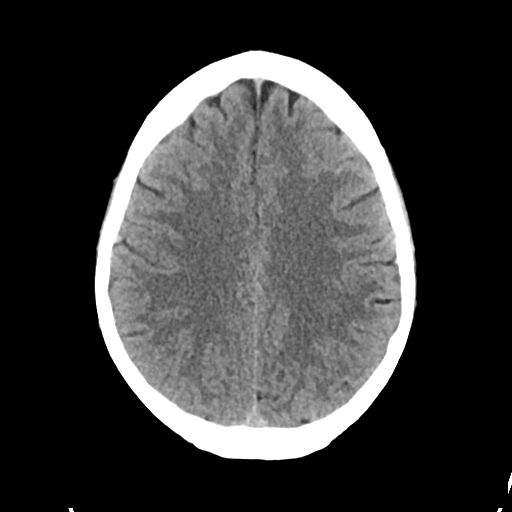
[im 25/29  brain]
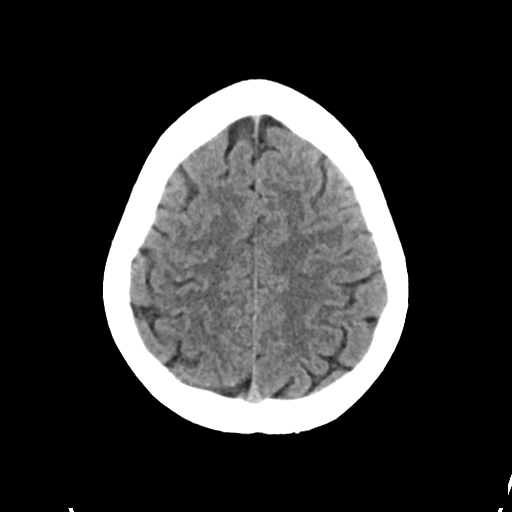

[Series 4: head bone · axial · 0.44mm/px · z∈[-44,-16]mm · 3 of 73 slices shown]
[im 8/73  bone]
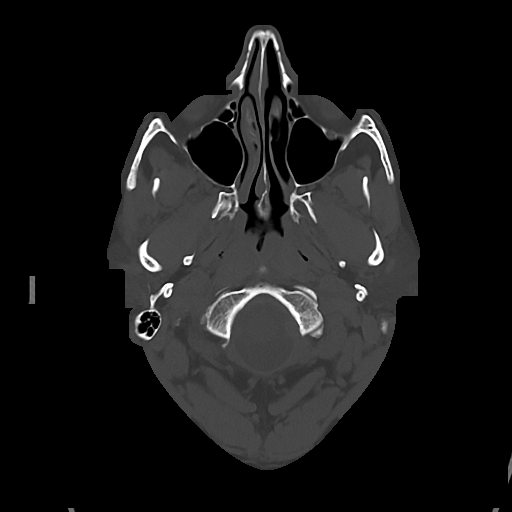
[im 15/73  bone]
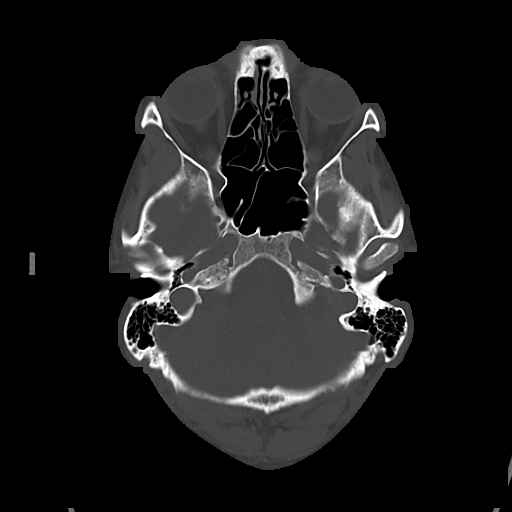
[im 22/73  bone]
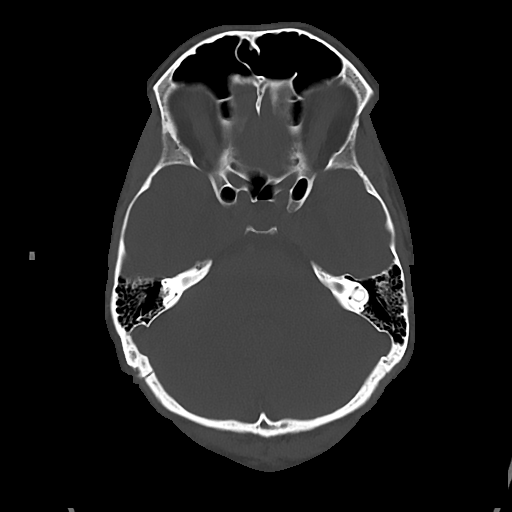

[Series 5: cor soft · coronal · 0.35mm/px · 3 of 73 slices shown]
[im 25/73  brain]
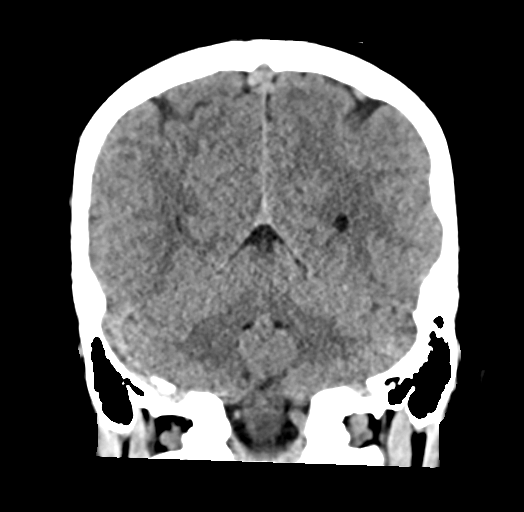
[im 33/73  brain]
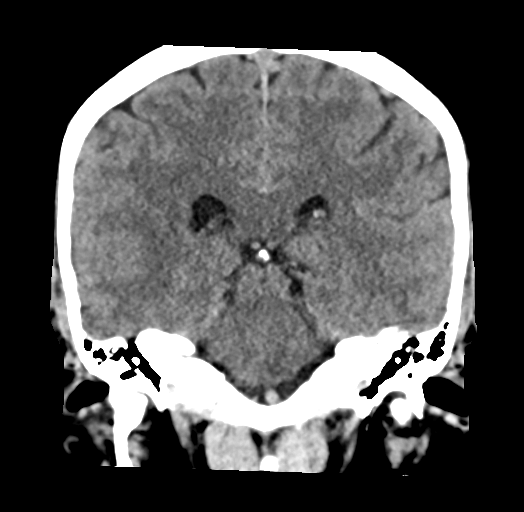
[im 41/73  brain]
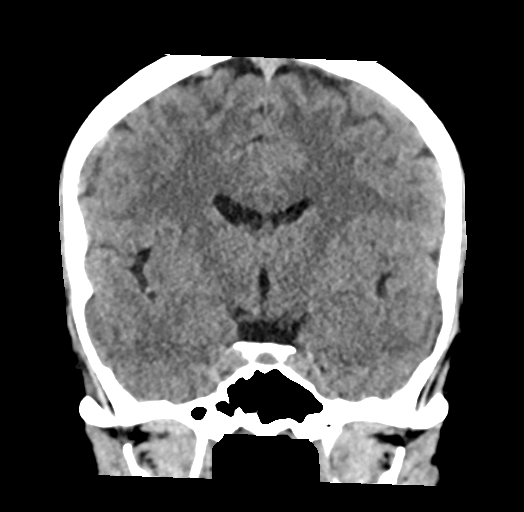

[Series 6: sag soft · sagittal · 0.32mm/px · 3 of 60 slices shown]
[im 20/60  brain]
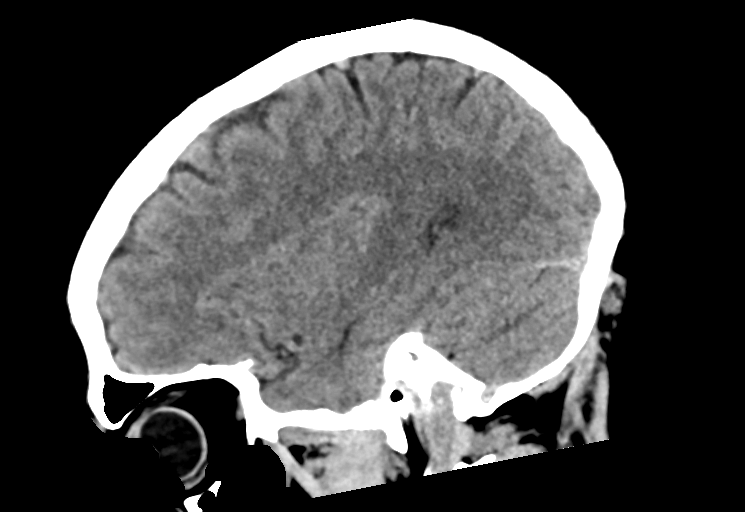
[im 30/60  brain]
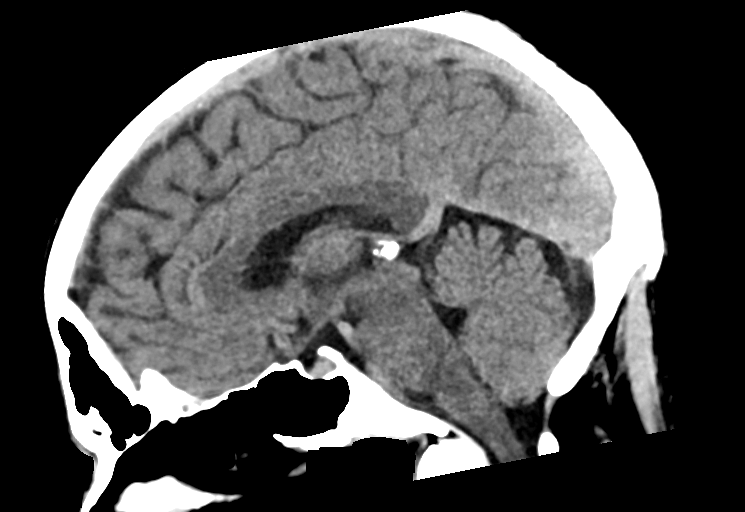
[im 40/60  brain]
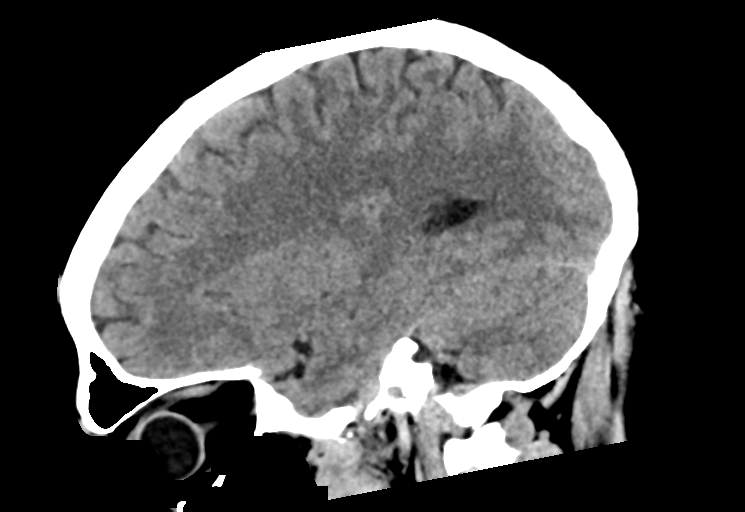

[16 of 47 positions shown; findings below may reference images not displayed]

FINDINGS: Brain: No evidence of infarction, hemorrhage, hydrocephalus,
extra-axial collection or mass lesion/mass effect.

Vascular: No hyperdense vessel or unexpected calcification.

Skull: Normal. Negative for fracture or focal lesion.

Sinuses/Orbits: No acute finding.

Other: None.
IMPRESSION: Unremarkable noncontrast head CT.

## 2017-07-26 ENCOUNTER — Other Ambulatory Visit: Payer: Self-pay | Admitting: Family Medicine

## 2017-07-26 ENCOUNTER — Ambulatory Visit (INDEPENDENT_AMBULATORY_CARE_PROVIDER_SITE_OTHER): Payer: BLUE CROSS/BLUE SHIELD | Admitting: Cardiovascular Disease

## 2017-07-26 ENCOUNTER — Encounter: Payer: Self-pay | Admitting: Cardiovascular Disease

## 2017-07-26 VITALS — BP 126/82 | HR 87 | Ht 71.0 in | Wt 227.0 lb

## 2017-07-26 DIAGNOSIS — I471 Supraventricular tachycardia: Secondary | ICD-10-CM | POA: Diagnosis not present

## 2017-07-26 DIAGNOSIS — I10 Essential (primary) hypertension: Secondary | ICD-10-CM | POA: Diagnosis not present

## 2017-07-26 DIAGNOSIS — R0602 Shortness of breath: Secondary | ICD-10-CM | POA: Diagnosis not present

## 2017-07-26 DIAGNOSIS — R079 Chest pain, unspecified: Secondary | ICD-10-CM | POA: Diagnosis not present

## 2017-07-26 MED ORDER — DILTIAZEM HCL ER COATED BEADS 120 MG PO CP24: 120.0000 mg | ORAL_CAPSULE | Freq: Every day | ORAL | 4 refills | Status: DC

## 2017-07-26 MED ORDER — TAMSULOSIN HCL 0.4 MG PO CAPS: 0.4000 mg | ORAL_CAPSULE | Freq: Every day | ORAL | 1 refills | Status: DC

## 2017-07-26 NOTE — Patient Instructions (Addendum)
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 Please call us if you have new issues that need to be addressed before your next appt.  [phone]  Your physician wants you to follow-up in: as needed yearly  If you need a refill on your cardiac medications before your next appointment, please call your pharmacy.

## 2017-08-02 ENCOUNTER — Encounter: Payer: Self-pay | Admitting: Family Medicine

## 2017-08-02 MED ORDER — DICLOFENAC SODIUM 2 % TD SOLN: TRANSDERMAL | 5 refills | Status: DC

## 2017-08-23 ENCOUNTER — Encounter: Payer: Self-pay | Admitting: Family Medicine

## 2017-08-24 MED ORDER — DICLOFENAC SODIUM 2 % TD SOLN: TRANSDERMAL | 5 refills | Status: DC

## 2017-12-19 ENCOUNTER — Encounter: Payer: Self-pay | Admitting: Family Medicine

## 2017-12-19 ENCOUNTER — Ambulatory Visit (INDEPENDENT_AMBULATORY_CARE_PROVIDER_SITE_OTHER): Payer: BLUE CROSS/BLUE SHIELD | Admitting: Family Medicine

## 2017-12-19 VITALS — BP 116/84 | HR 74 | Temp 98.9°F | Ht 71.0 in | Wt 225.1 lb

## 2017-12-19 DIAGNOSIS — Z23 Encounter for immunization: Secondary | ICD-10-CM | POA: Diagnosis not present

## 2017-12-19 DIAGNOSIS — R21 Rash and other nonspecific skin eruption: Secondary | ICD-10-CM

## 2017-12-19 MED ORDER — FLUOCINONIDE-E 0.05 % EX CREA: 1.0000 "application " | TOPICAL_CREAM | Freq: Two times a day (BID) | CUTANEOUS | 0 refills | Status: DC

## 2017-12-19 NOTE — Assessment & Plan Note (Signed)
New- ? Chigger bites? None appear infected. Continue as needed antihistamine for itching. eRx sent for topical lidex to use twice daily. Update me in 7- 10 days. The patient indicates understanding of these issues and agrees with the plan.

## 2017-12-19 NOTE — Progress Notes (Signed)
Subjective:   Patient ID: Chad Harrington, male    DOB: [birth year], 42 y.o.   MRN: [id number]  Chad Harrington is a pleasant 42 y.o. year old male who presents to clinic today with Rash (Patient is here today C/O a rash.  He states that it started on bilateral ankles about a week ago and says that he went through the woods then as well.  Itches, and there are a couple spots that have shown up at left knee.   He agrees to get Tdap today.)  on 12/19/2017  HPI:  Patient is here today C/O a rash. He states that it started on bilateral ankles about a week ago and says that he went through the woods then as well. Itches, and there are a couple spots that have shown up at left knee. He agrees to get Tdap today.  Benadryl has helped with the itchy.  No fevers chills, or HA. Did not remove any ticks.   Current Outpatient Medications on File Prior to Visit  Medication Sig Dispense Refill  . Diclofenac Sodium (PENNSAID) 2 % SOLN Apply 1 pump (20 mg) Topically to affected area twice a day 112 g 5  . diltiazem (CARDIZEM CD) 120 MG 24 hr capsule Take 1 capsule (120 mg total) by mouth daily. 90 capsule 4  . omeprazole (PRILOSEC) 40 MG capsule Take 1 capsule (40 mg total) by mouth daily. 30 capsule 3  . tamsulosin (FLOMAX) 0.4 MG CAPS capsule Take 1 capsule (0.4 mg total) by mouth daily. 90 capsule 1   No current facility-administered medications on file prior to visit.     No Known Allergies  Past Medical History:  Diagnosis Date  . Atrial fibrillation (HCC)   . BPH (benign prostatic hyperplasia)   . Mitral valve prolapse   . Prostate infection 04/2013    Past Surgical History:  Procedure Laterality Date  . KNEE SURGERY      Family History  Problem Relation Age of Onset  . Diabetes Father   . COPD Father   . Hypertension Father   . Heart disease Father 72       MI  . Heart attack Father   . Cancer Maternal Grandfather        colon CA    Social History   Socioeconomic  History  . Marital status: Married    Spouse name: Not on file  . Number of children: Not on file  . Years of education: Not on file  . Highest education level: Not on file  Occupational History  . Not on file  Social Needs  . Financial resource strain: Not on file  . Food insecurity:    Worry: Not on file    Inability: Not on file  . Transportation needs:    Medical: Not on file    Non-medical: Not on file  Tobacco Use  . Smoking status: Never Smoker  . Smokeless tobacco: Never Used  Substance and Sexual Activity  . Alcohol use: Yes    Alcohol/week: 0.0 oz    Comment: occassionally.  . Drug use: No  . Sexual activity: Not on file  Lifestyle  . Physical activity:    Days per week: Not on file    Minutes per session: Not on file  . Stress: Not on file  Relationships  . Social connections:    Talks on phone: Not on file    Gets together: Not on file    Attends religious service:  Not on file    Active member of club or organization: Not on file    Attends meetings of clubs or organizations: Not on file    Relationship status: Not on file  . Intimate partner violence:    Fear of current or ex partner: Not on file    Emotionally abused: Not on file    Physically abused: Not on file    Forced sexual activity: Not on file  Other Topics Concern  . Not on file  Social History Narrative  . Not on file   The PMH, PSH, Social History, Family History, Medications, and allergies [facility], and have been updated if relevant.    Review of Systems  Constitutional: Negative.   Eyes: Negative.   Cardiovascular: Negative.   Gastrointestinal: Negative.   Musculoskeletal: Negative.   Skin: Positive for rash.  Neurological: Negative.   All other systems reviewed and are negative.      Objective:    BP 116/84 (BP Location: Left Arm, Patient Position: Sitting, Cuff Size: Normal)   Pulse 74   Temp 98.9 F (37.2 C) (Oral)   Ht 5\' 11"  (1.803 m)   Wt 225 lb  1.6 oz (102.1 kg)   SpO2 96%   BMI 31.40 kg/m    Physical Exam  Constitutional: He is oriented to person, place, and time. He appears well-developed and well-nourished. No distress.  HENT:  Head: Normocephalic and atraumatic.  Eyes: EOM are normal.  Cardiovascular: Normal rate.  Pulmonary/Chest: Effort normal.  Musculoskeletal: Normal range of motion.  Neurological: He is alert and oriented to person, place, and time. No cranial nerve deficit.  Skin: Rash noted. Rash is macular and papular. He is not diaphoretic.  Raised papular rash on ankles bilaterally with excoriation marks  Psychiatric: He has a normal mood and affect. His behavior is normal. Judgment and thought content normal.  Nursing note and vitals reviewed.         Assessment & Plan:   Need for Tdap vaccination - Plan: Tdap vaccine greater than or equal to 7yo IM  Rash No follow-ups on file.

## 2017-12-19 NOTE — Patient Instructions (Signed)
Great to see you.  Apply lidex cream twice daily for 7- 10 days.  You can take zyrtec in the daytime and benadryl at night for itching.

## 2017-12-27 ENCOUNTER — Ambulatory Visit: Payer: BLUE CROSS/BLUE SHIELD | Admitting: Family Medicine

## 2018-01-28 ENCOUNTER — Encounter: Payer: Self-pay | Admitting: Family Medicine

## 2018-01-29 ENCOUNTER — Encounter: Payer: Self-pay | Admitting: Family Medicine

## 2018-01-29 NOTE — Progress Notes (Deleted)
Dr. Karleen Hampshire T. Carter Kassel, MD, CAQ Sports Medicine Primary [facility] and Sports Medicine [address] [place], [address] Phone: [phone] Fax: [phone]  01/30/2018  Patient: Chad Harrington, MRN: [id number], DOB: April 05, 1976, 42 y.o.  Primary Physician:  Dianne Dun, MD   No chief complaint on file.  Subjective:   Chad Harrington is a 42 y.o. very pleasant male patient who presents with the following:  I remember the patient fairly well, having seen him in 2019 as well as 2015.  He has a significant history for left greater than right knee pain for more than a decade and had a knee injury initially in the year 2000.  He did have a arthroscopic partial medial meniscectomy done by Dr. Luiz Blare.  He works as a Curator.  His knee pain is essentially off and on, and I did a intra-articular injection of his left knee in 2015 where he had a long relief of symptoms for several years.  I did bilateral knee injections for the gentleman in January 2019.  Past Medical History, Surgical History, Social History, Family History, Problem List, Medications, and Allergies have been reviewed and updated if relevant.  Patient Active Problem List   Diagnosis Date Noted  . Rash 12/19/2017  . Erectile dysfunction 12/28/2016  . Knee pain 10/25/2016  . Paroxysmal supraventricular tachycardia (HCC) 02/18/2015  . Joint swelling, finger, hand 08/27/2014  . Hand pain, left 08/27/2014  . 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 Essential hypertension 04/29/2014  . Polyuria 05/14/2013  . Numbness and tingling of right arm 02/24/2013  . History of mitral valve prolapse 02/24/2013    Past Medical History:  Diagnosis Date  . Atrial fibrillation (HCC)   . BPH (benign prostatic hyperplasia)   . Mitral valve prolapse   . Prostate infection 04/2013    Past Surgical History:  Procedure Laterality Date  . KNEE SURGERY      Social History   Socioeconomic History  . Marital status:  Married    Spouse name: Not on file  . Number of children: Not on file  . Years of education: Not on file  . Highest education level: Not on file  Occupational History  . Not on file  Social Needs  . Financial resource strain: Not on file  . Food insecurity:    Worry: Not on file    Inability: Not on file  . Transportation needs:    Medical: Not on file    Non-medical: Not on file  Tobacco Use  . Smoking status: Never Smoker  . Smokeless tobacco: Never Used  Substance and Sexual Activity  . Alcohol use: Yes    Alcohol/week: 0.0 standard drinks    Comment: occassionally.  . Drug use: No  . Sexual activity: Not on file  Lifestyle  . Physical activity:    Days per week: Not on file    Minutes per session: Not on file  . Stress: Not on file  Relationships  . Social connections:    Talks on phone: Not on file    Gets together: Not on file    Attends religious service: Not on file    Active member of club or organization: Not on file    Attends meetings of clubs or organizations: Not on file    Relationship status: Not on file  . Intimate partner violence:    Fear of current or ex partner: Not on file    Emotionally abused: Not on file  Physically abused: Not on file    Forced sexual activity: Not on file  Other Topics Concern  . Not on file  Social History Narrative  . Not on file    Family History  Problem Relation Age of Onset  . Diabetes Father   . COPD Father   . Hypertension Father   . Heart disease Father [id number]       MI  . Heart attack Father   . Cancer Maternal Grandfather        colon CA    No Known Allergies  Medication list reviewed and updated in full in [facility].  GEN: No fevers, chills. Nontoxic. Primarily MSK c/o today. MSK: Detailed in the HPI GI: tolerating PO intake without difficulty Neuro: No numbness, parasthesias, or tingling associated. Otherwise the pertinent positives of the ROS are noted above.   Objective:   There  were no vitals taken for this visit.  ***  Radiology: No results found.  Assessment and Plan:   ***

## 2018-01-30 ENCOUNTER — Ambulatory Visit (INDEPENDENT_AMBULATORY_CARE_PROVIDER_SITE_OTHER): Payer: BLUE CROSS/BLUE SHIELD | Admitting: Family Medicine

## 2018-01-30 ENCOUNTER — Encounter: Payer: Self-pay | Admitting: Family Medicine

## 2018-01-30 ENCOUNTER — Ambulatory Visit: Payer: BLUE CROSS/BLUE SHIELD | Admitting: Family Medicine

## 2018-01-30 VITALS — BP 110/74 | HR 69 | Temp 98.5°F | Ht 71.0 in | Wt 225.8 lb

## 2018-01-30 DIAGNOSIS — M7652 Patellar tendinitis, left knee: Secondary | ICD-10-CM | POA: Diagnosis not present

## 2018-01-30 DIAGNOSIS — M25562 Pain in left knee: Secondary | ICD-10-CM

## 2018-01-30 MED ORDER — PREDNISONE 20 MG PO TABS: ORAL_TABLET | ORAL | 0 refills | Status: DC

## 2018-01-30 NOTE — Telephone Encounter (Signed)
Mr. Chad Harrington is scheduled to see Dr. Patsy Lageropland today at 11:00 am.

## 2018-01-30 NOTE — Progress Notes (Signed)
Dr. Karleen Hampshire T. Rebekah Zackery, MD, CAQ Sports Medicine Primary Care and Sports Medicine [address] [place], [address] Phone: [phone] Fax: [phone]  01/30/2018  Patient: Chad Harrington, MRN: [id number], DOB: 14-Apr-1976, 42 y.o.  Primary Physician:  Dianne Dun, MD   Chief Complaint  Patient presents with  . Joint Swelling    Left knee x 3 weeks   Subjective:   Chad Harrington is a 42 y.o. very pleasant male patient who presents with the following:  L knee swollen for 3 weeks.  He is a pleasant gentleman who I recall well.  He is seen today with knee pain that is been ongoing for about 3 weeks.  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  He works 7 days a week right now.  Is having some catching and popping in his knee, but primarily is having anterior knee pain.  July 14 - 22, worked at [employer]. Had to sit for a week.   Past Medical History, Surgical History, Social History, Family History, Problem List, Medications, and Allergies have been reviewed and updated if relevant.  Patient Active Problem List   Diagnosis Date Noted  . Rash 12/19/2017  . Erectile dysfunction 12/28/2016  . Knee pain 10/25/2016  . Paroxysmal supraventricular tachycardia (HCC) 02/18/2015  . Joint swelling, finger, hand 08/27/2014  . Hand pain, left 08/27/2014  . 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 Essential hypertension 04/29/2014  . Polyuria 05/14/2013  . Numbness and tingling of right arm 02/24/2013  . History of mitral valve prolapse 02/24/2013    Past Medical History:  Diagnosis Date  . Atrial fibrillation (HCC)   . BPH (benign prostatic hyperplasia)   . Mitral valve prolapse   . Prostate infection 04/2013    Past Surgical History:  Procedure Laterality Date  . KNEE SURGERY      Social History   Socioeconomic History  . Marital  status: Married    Spouse name: Not on file  . Number of children: Not on file  . Years of education: Not on file  . Highest education level: Not on file  Occupational History  . Not on file  Social Needs  . Financial resource strain: Not on file  . Food insecurity:    Worry: Not on file    Inability: Not on file  . Transportation needs:    Medical: Not on file    Non-medical: Not on file  Tobacco Use  . Smoking status: Never Smoker  . Smokeless tobacco: Never Used  Substance and Sexual Activity  . Alcohol use: Yes    Alcohol/week: 0.0 standard drinks    Comment: occassionally.  . Drug use: No  . Sexual activity: Not on file  Lifestyle  . Physical activity:    Days per week: Not on file    Minutes per session: Not on file  . Stress: Not on file  Relationships  . Social connections:    Talks on phone: Not on file    Gets together: Not on file    Attends religious service: Not on file    Active member of club or organization: Not on file    Attends meetings of clubs or organizations: Not on file    Relationship status: Not on file  . Intimate partner violence:    Fear  of current or ex partner: Not on file    Emotionally abused: Not on file    Physically abused: Not on file    Forced sexual activity: Not on file  Other Topics Concern  . Not on file  Social History Narrative  . Not on file    Family History  Problem Relation Age of Onset  . Diabetes Father   . COPD Father   . Hypertension Father   . Heart disease Father [id number]       MI  . Heart attack Father   . Cancer Maternal Grandfather        colon CA    No Known Allergies  Medication list reviewed and updated in full in [facility].  GEN: No fevers, chills. Nontoxic. Primarily MSK c/o today. MSK: Detailed in the HPI GI: tolerating PO intake without difficulty Neuro: No numbness, parasthesias, or tingling associated. Otherwise the pertinent positives of the ROS are noted above.   Objective:    BP 110/74   Pulse 69   Temp 98.5 F (36.9 C) (Oral)   Ht 5\' 11"  (1.803 m)   Wt 225 lb 12 oz (102.4 kg)   BMI 31.49 kg/m    GEN: WDWN, NAD, Non-toxic, Alert & Oriented x 3 HEENT: Atraumatic, Normocephalic.  Ears and Nose: No external deformity. EXTR: No clubbing/cyanosis/edema NEURO: Normal gait.  PSYCH: Normally interactive. Conversant. Not depressed or anxious appearing.  Calm demeanor.   Knee:  L Gait: Normal heel toe pattern ROM: 0-130 Effusion: neg Echymosis or edema: none Patellar tendon TTP Painful PLICA: neg Patellar grind: negative Medial and lateral patellar facet loading: negative medial and lateral joint lines:NT Mcmurray's neg Flexion-pinch neg Varus and valgus stress: stable Lachman: neg Ant and Post drawer: neg Hip abduction, IR, ER: WNL Hip flexion str: 5/5 Hip abd: 5/5 Quad: 5/5 VMO atrophy:No Hamstring concentric and eccentric: 5/5   Radiology: No results found.  Assessment and Plan:   Patellar tendonitis of left knee  Acute pain of left knee  Classic patellar tendinitis.  I do not suspect internal derangement in this case.  I am going to start him with some oral prednisone burst and then if he does not improve we can do some additional therapy.  Topical nitroglycerin would be a reasonable idea.  He is already doing such amount of physical work in labor, that I am not can have him do additional rehabilitation.  Follow-up: No follow-ups on file.  Meds ordered this encounter  Medications  . predniSONE (DELTASONE) 20 MG tablet    Sig: 2 tabs po for 5 days, then 1 tabs po for 5 days    Dispense:  15 tablet    Refill:  0   Signed,  Jakobee Brackins T. Ayona Yniguez, MD   Allergies as of 01/30/2018   No Known Allergies     Medication List        Accurate as of 01/30/18 11:26 AM. Always use your most recent med list.          Diclofenac Sodium 2 % Soln Apply 1 pump (20 mg) Topically to affected area twice a day   diltiazem 120 MG 24 hr  capsule Commonly known as:  CARDIZEM CD Take 1 capsule (120 mg total) by mouth daily.   fluocinonide-emollient 0.05 % cream Commonly known as:  LIDEX-E Apply 1 application topically 2 (two) times daily.   omeprazole 40 MG capsule Commonly known as:  PRILOSEC Take 1 capsule (40 mg total) by mouth daily.  predniSONE 20 MG tablet Commonly known as:  DELTASONE 2 tabs po for 5 days, then 1 tabs po for 5 days   tamsulosin 0.4 MG Caps capsule Commonly known as:  FLOMAX Take 1 capsule (0.4 mg total) by mouth daily.

## 2018-04-10 ENCOUNTER — Encounter: Payer: Self-pay | Admitting: Family Medicine

## 2018-04-10 ENCOUNTER — Ambulatory Visit (INDEPENDENT_AMBULATORY_CARE_PROVIDER_SITE_OTHER): Payer: BLUE CROSS/BLUE SHIELD | Admitting: Family Medicine

## 2018-04-10 VITALS — BP 136/74 | HR 121 | Temp 102.7°F | Ht 71.0 in | Wt 221.2 lb

## 2018-04-10 DIAGNOSIS — R509 Fever, unspecified: Secondary | ICD-10-CM | POA: Diagnosis not present

## 2018-04-10 DIAGNOSIS — J101 Influenza due to other identified influenza virus with other respiratory manifestations: Secondary | ICD-10-CM

## 2018-04-10 LAB — POC INFLUENZA A&B (BINAX/QUICKVUE)
Influenza A, POC: NEGATIVE
Influenza B, POC: POSITIVE — AB

## 2018-04-10 MED ORDER — OSELTAMIVIR PHOSPHATE 75 MG PO CAPS: 75.0000 mg | ORAL_CAPSULE | Freq: Two times a day (BID) | ORAL | 0 refills | Status: DC

## 2018-04-10 NOTE — Patient Instructions (Signed)

## 2018-04-10 NOTE — Progress Notes (Signed)
SUBJECTIVE:  Chad Harrington is a 42 y.o. male who present complaining of flu-like symptoms: fevers, chills, myalgias, congestion, sore throat and cough for 3 days. Denies dyspnea or wheezing.  Current Outpatient Medications on File Prior to Visit  Medication Sig Dispense Refill  . Diclofenac Sodium (PENNSAID) 2 % SOLN Apply 1 pump (20 mg) Topically to affected area twice a day 112 g 5  . diltiazem (CARDIZEM CD) 120 MG 24 hr capsule Take 1 capsule (120 mg total) by mouth daily. 90 capsule 4  . fluocinonide-emollient (LIDEX-E) 0.05 % cream Apply 1 application topically 2 (two) times daily. 30 g 0  . omeprazole (PRILOSEC) 40 MG capsule Take 1 capsule (40 mg total) by mouth daily. 30 capsule 3  . tamsulosin (FLOMAX) 0.4 MG CAPS capsule Take 1 capsule (0.4 mg total) by mouth daily. 90 capsule 1   No current facility-administered medications on file prior to visit.     No Known Allergies  Past Medical History:  Diagnosis Date  . Atrial fibrillation (HCC)   . BPH (benign prostatic hyperplasia)   . Mitral valve prolapse   . Prostate infection 04/2013    Past Surgical History:  Procedure Laterality Date  . KNEE SURGERY      Family History  Problem Relation Age of Onset  . Diabetes Father   . COPD Father   . Hypertension Father   . Heart disease Father 54       MI  . Heart attack Father   . Cancer Maternal Grandfather        colon CA    Social History   Socioeconomic History  . Marital status: Married    Spouse name: Not on file  . Number of children: Not on file  . Years of education: Not on file  . Highest education level: Not on file  Occupational History  . Not on file  Social Needs  . Financial resource strain: Not on file  . Food insecurity:    Worry: Not on file    Inability: Not on file  . Transportation needs:    Medical: Not on file    Non-medical: Not on file  Tobacco Use  . Smoking status: Never Smoker  . Smokeless tobacco: Never Used  Substance  and Sexual Activity  . Alcohol use: Yes    Alcohol/week: 0.0 standard drinks    Comment: occassionally.  . Drug use: No  . Sexual activity: Not on file  Lifestyle  . Physical activity:    Days per week: Not on file    Minutes per session: Not on file  . Stress: Not on file  Relationships  . Social connections:    Talks on phone: Not on file    Gets together: Not on file    Attends religious service: Not on file    Active member of club or organization: Not on file    Attends meetings of clubs or organizations: Not on file    Relationship status: Not on file  . Intimate partner violence:    Fear of current or ex partner: Not on file    Emotionally abused: Not on file    Physically abused: Not on file    Forced sexual activity: Not on file  Other Topics Concern  . Not on file  Social History Narrative  . Not on file   The PMH, PSH, Social History, Family History, Medications, and allergies have been reviewed in [facility], and have been updated if relevant.  OBJECTIVE: BP 136/74 (BP Location: Left Arm, Patient Position: Sitting, Cuff Size: Normal)   Pulse (!) 121   Temp (!) 102.7 F (39.3 C) (Oral)   Ht 5\' 11"  (1.803 m)   Wt 221 lb 3.2 oz (100.3 kg)   SpO2 91%   BMI 30.85 kg/m   Appears moderately ill but not toxic; temperature as noted in vitals. Ears normal. Throat and pharynx normal.  Neck supple. No adenopathy in the neck. Sinuses non tender. The chest is clear.  ASSESSMENT: Influenza  PLAN: Rapid flu positive for influenza B. Tamilflu 75 mg twice daily x 5 days. Symptomatic therapy suggested: rest, increase fluids and call prn if symptoms persist or worsen. Call or return to clinic prn if these symptoms worsen or fail to improve as anticipated.

## 2018-05-15 ENCOUNTER — Other Ambulatory Visit: Payer: Self-pay | Admitting: Family Medicine

## 2018-05-15 MED ORDER — OSELTAMIVIR PHOSPHATE 75 MG PO CAPS: 75.0000 mg | ORAL_CAPSULE | Freq: Every day | ORAL | 0 refills | Status: AC

## 2018-09-13 ENCOUNTER — Other Ambulatory Visit: Payer: Self-pay | Admitting: Cardiovascular Disease

## 2018-09-19 ENCOUNTER — Encounter: Payer: Self-pay | Admitting: Family Medicine

## 2018-09-19 ENCOUNTER — Other Ambulatory Visit: Payer: Self-pay

## 2018-09-19 MED ORDER — DICLOFENAC SODIUM 2 % TD SOLN: TRANSDERMAL | 5 refills | Status: DC

## 2018-09-27 ENCOUNTER — Telehealth: Payer: Self-pay

## 2018-09-27 NOTE — Telephone Encounter (Signed)
Virtual Visit Pre-Appointment Phone Call  Steps For Call:  1. Confirm consent - "In the setting of the current Covid19 crisis, you are scheduled for a VIDEO visit with your provider on 10/03/2018 at 3:40PM.  Just as we do with many in-office visits, in order for you to participate in this visit, we must obtain consent.  If you'd like, I can send this to your mychart (if signed up) or email for you to review.  Otherwise, I can obtain your verbal consent now.  All virtual visits are billed to your insurance company just like a normal visit would be.  By agreeing to a virtual visit, we'd like you to understand that the technology does not allow for your provider to perform an examination, and thus may limit your provider's ability to fully assess your condition.  Finally, though the technology is pretty good, we cannot assure that it will always work on either your or our end, and in the setting of a video visit, we may have to convert it to a phone-only visit.  In either situation, we cannot ensure that we have a secure connection.  Are you willing to proceed?"  2. Give patient instructions for WebEx download to smartphone as below if video visit  3. Advise patient to be prepared with any vital sign or heart rhythm information, their current medicines, and a piece of paper and pen handy for any instructions they may receive the day of their visit  4. Inform patient they will receive a phone call 15 minutes prior to their appointment time (may be from unknown caller ID) so they should be prepared to answer  5. Confirm that appointment type is correct in Epic appointment notes (video vs telephone)    TELEPHONE CALL NOTE  Chad Harrington has been deemed a candidate for a follow-up tele-health visit to limit community exposure during the Covid-19 pandemic. I spoke with the patient via phone to ensure availability of phone/video source, confirm preferred email & phone number, and discuss  instructions and expectations.  I reminded Chad Harrington to be prepared with any vital sign and/or heart rhythm information that could potentially be obtained via home monitoring, at the time of his visit. I reminded Chad Harrington to expect a phone call at the time of his visit if his visit.  Did the patient verbally acknowledge consent to treatment? YES  Margrett Rud, [place] 09/27/2018 8:40 AM   CONSENT FOR TELE-HEALTH VISIT - PLEASE REVIEW  I hereby voluntarily request, consent and authorize [facility] and its employed or contracted physicians, physician assistants, nurse practitioners or other licensed health care professionals (the Practitioner), to provide me with telemedicine health care services (the Services") as deemed necessary by the treating Practitioner. I acknowledge and consent to receive the Services by the Practitioner via telemedicine. I understand that the telemedicine visit will involve communicating with the Practitioner through live audiovisual communication technology and the disclosure of certain medical information by electronic transmission. I acknowledge that I have been given the opportunity to request an in-person assessment or other available alternative prior to the telemedicine visit and am voluntarily participating in the telemedicine visit.  I understand that I have the right to withhold or withdraw my consent to the use of telemedicine in the course of my care at any time, without affecting my right to future care or treatment, and that the Practitioner or I may terminate the telemedicine visit at any time. I understand that I  have the right to inspect all information obtained and/or recorded in the course of the telemedicine visit and may receive copies of available information for a reasonable fee.  I understand that some of the potential risks of receiving the Services via telemedicine include:   Delay or interruption in medical evaluation due to  technological equipment failure or disruption;  Information transmitted may not be sufficient (e.g. poor resolution of images) to allow for appropriate medical decision making by the Practitioner; and/or   In rare instances, security protocols could fail, causing a breach of personal health information.  Furthermore, I acknowledge that it is my responsibility to provide information about my medical history, conditions and care that is complete and accurate to the best of my ability. I acknowledge that Practitioner's advice, recommendations, and/or decision may be based on factors not within their control, such as incomplete or inaccurate data provided by me or distortions of diagnostic images or specimens that may result from electronic transmissions. I understand that the practice of medicine is not an exact science and that Practitioner makes no warranties or guarantees regarding treatment outcomes. I acknowledge that I will receive a copy of this consent concurrently upon execution via email to the email address I last provided but may also request a printed copy by calling the office of [facility].    I understand that my insurance will be billed for this visit.   I have read or had this consent read to me.  I understand the contents of this consent, which adequately explains the benefits and risks of the Services being provided via telemedicine.   I have been provided ample opportunity to ask questions regarding this consent and the Services and have had my questions answered to my satisfaction.  I give my informed consent for the services to be provided through the use of telemedicine in my medical care  By participating in this telemedicine visit I agree to the above.

## 2018-10-02 ENCOUNTER — Telehealth: Payer: Self-pay

## 2018-10-02 NOTE — Telephone Encounter (Signed)
Spoke with patients wife. Reviewed steps for tomorrows video visit.  Preformed a Doxy Trial - Video and Audio worked.

## 2018-10-02 NOTE — Progress Notes (Addendum)
Virtual Visit via Video Note   This visit type was conducted due to national recommendations for restrictions regarding the COVID-19 Pandemic (e.g. social distancing) in an effort to limit this patient's exposure and mitigate transmission in our community.  Due to his co-morbid illnesses, this patient is at least at moderate risk for complications without adequate follow up.  This format is felt to be most appropriate for this patient at this time.  All issues noted in this document were discussed and addressed.  A limited physical exam was performed with this format.  Please refer to the patient's chart for his consent to telehealth for [name].   Date:  10/02/2018   ID:  Chad Harrington, DOB 1975-07-13, MRN [id number]  Patient Location:  [address] [facility] [place] [address]   Provider location:   [facility], [place] office  PCP:  Dianne Dun, MD  Cardiologist:  Hubbard Robinson [facility]  Chief Complaint:  tachycardia   History of Present Illness:    Chad Harrington is a 43 y.o. male who presents via audio/video conferencing for a telehealth visit today.   The patient does not symptoms concerning for COVID-19 infection (fever, chills, cough, or new SHORTNESS OF BREATH).   Patient has a past medical history of chronic chest pain dating back to age 68,  hypertension,  Father,  3 girls all teenagers who presents for routine follow-up for his chest pain,Essential hypertension  Blood pressure running high at home typically 150 systolic Compliant with his diltiazem extended release 120 daily He does have a diltiazem 30 mg but has not been taking this No tachycardia No chest pain, no shortness of breath  Breakthrough when not on the pill Had some chest tightness went back on the medication and symptoms resolved   never smoked No diabetes,  Feels tired all of the time significant snoring per the wife, she does not think it is apnea   Previously reported having some difficulty swallowing Now resolved   Family history father passed away from CHF, MI, COPD Brother also with sleep apnea, COPD Both his brother and father were smokers  Other past medical history Previously he has had numerous trips to the emergency room, workup in the past. He has been treated with various medications including atenolol, Toprol with no improvement of his symptoms. visits to the emergency room did not yield a reason for his chest pain. Felt to have GERD. Never tried proton pump inhibitors. Told to try H2 blockers such as Zantac. He has tried NSAIDs before with no relief of his symptoms.   Prior treadmill stress test showing no ischemia on EKG  Reports that he was diagnosed with mitral valve prolapse at age 5.   Also had episodes of sweating, tachycardia, weakness and dizziness at the end of 2013. No recent episodes    family history, brother had CAD in his late 43s. He is a smoker father also has history of CAD, stroke, CHF in his 80s also a smoker    Prior CV studies:   The following studies were reviewed today:    Past Medical History:  Diagnosis Date  . Atrial fibrillation (HCC)   . BPH (benign prostatic hyperplasia)   . Mitral valve prolapse   . Prostate infection 04/2013   Past Surgical History:  Procedure Laterality Date  . KNEE SURGERY       No outpatient medications have been marked as taking for the 10/03/18 encounter (Appointment) with Mariah Milling,  Tollie Pizzaimothy J, MD.     Allergies:   Patient has no known allergies.   Social History   Tobacco Use  . Smoking status: Never Smoker  . Smokeless tobacco: Never Used  Substance Use Topics  . Alcohol use: Yes    Alcohol/week: 0.0 standard drinks    Comment: occassionally.  . Drug use: No     Current Outpatient Medications on File Prior to Visit  Medication Sig Dispense Refill  . CARTIA XT 120 MG 24 hr capsule TAKE ONE CAPSULE BY MOUTH DAILY. 90 capsule 0  .  Diclofenac Sodium (PENNSAID) 2 % SOLN Apply 1 pump (20 mg) Topically to affected area twice a day 112 g 5  . fluocinonide-emollient (LIDEX-E) 0.05 % cream Apply 1 application topically 2 (two) times daily. 30 g 0  . omeprazole (PRILOSEC) 40 MG capsule Take 1 capsule (40 mg total) by mouth daily. 30 capsule 3  . tamsulosin (FLOMAX) 0.4 MG CAPS capsule Take 1 capsule (0.4 mg total) by mouth daily. 90 capsule 1   No current facility-administered medications on file prior to visit.      Family Hx: The patient's family history includes COPD in his father; Cancer in his maternal grandfather; Diabetes in his father; Heart attack in his father; Heart disease (age of onset: 8875) in his father; Hypertension in his father.  ROS:   Please see the history of present illness.    Review of Systems  Constitutional: Negative.   Respiratory: Negative.   Cardiovascular: Negative.   Gastrointestinal: Negative.   Musculoskeletal: Negative.   Neurological: Negative.   Psychiatric/Behavioral: Negative.   All other systems reviewed and are negative.     Labs/Other Tests and Data Reviewed:    Recent Labs: No results found for requested labs within last 8760 hours.   Recent Lipid Panel Lab Results  Component Value Date/Time   CHOL 171 02/25/2015 10:44 AM   TRIG 97.0 02/25/2015 10:44 AM   HDL 38.30 (L) 02/25/2015 10:44 AM   CHOLHDL 4 02/25/2015 10:44 AM   LDLCALC 113 (H) 02/25/2015 10:44 AM    Wt Readings from Last 3 Encounters:  04/10/18 221 lb 3.2 oz (100.3 kg)  01/30/18 225 lb 12 oz (102.4 kg)  12/19/17 225 lb 1.6 oz (102.1 kg)     Exam:    Vital Signs: Vital signs may also be detailed in the HPI There were no vitals taken for this visit.  Wt Readings from Last 3 Encounters:  04/10/18 221 lb 3.2 oz (100.3 kg)  01/30/18 225 lb 12 oz (102.4 kg)  12/19/17 225 lb 1.6 oz (102.1 kg)   Temp Readings from Last 3 Encounters:  04/10/18 (!) 102.7 F (39.3 C) (Oral)  01/30/18 98.5 F (36.9 C)  (Oral)  12/19/17 98.9 F (37.2 C) (Oral)   BP Readings from Last 3 Encounters:  04/10/18 136/74  01/30/18 110/74  12/19/17 116/84   Pulse Readings from Last 3 Encounters:  04/10/18 (!) 121  01/30/18 69  12/19/17 74    Vitals 130-160/80-90, average 150 up to 155 systolic Pulse 70 to 80 resp 16  Well nourished, well developed male in no acute distress. Constitutional:  oriented to person, place, and time. No distress.  Head: Normocephalic and atraumatic.  Eyes:  no discharge. No scleral icterus.  Neck: Normal range of motion. Neck supple.  Pulmonary/Chest: No audible wheezing, no distress, appears comfortable Musculoskeletal: Normal range of motion.  no  tenderness or deformity.  Neurological:   Coordination normal. Full exam not  performed Skin:  No rash Psychiatric:  normal mood and affect. behavior is normal. Thought content normal.    ASSESSMENT & PLAN:    Essential hypertension - Plan: EKG 12-Lead Blood pressure running high, typically 150 systolic or higher We will increase diltiazem up to 180 mg daily and he will continue to monitor pressures  Chest discomfort No significant chest pain No further work-up at this time.  Will stay on diltiazem as this seems to improve his symptoms  Shortness of breath Symptoms have resolved with better blood pressure control Recommended regular walking program  BPH  See his Flomax through primary care   Paroxysmal supraventricular tachycardia (HCC) Denies any tachycardia concerning for arrhythmia Symptoms well controlled on diltiazem    COVID-19 Education: The signs and symptoms of COVID-19 were discussed with the patient and how to seek care for testing (follow up with PCP or arrange E-visit).  The importance of social distancing was discussed today.  Patient Risk:   After full review of this patients clinical status, I feel that they are at least moderate risk at this time.  Time:   Today, I have spent 25 minutes  with the patient with telehealth technology discussing the cardiac and medical problems/diagnoses detailed above     Medication Adjustments/Labs and Tests Ordered: Current medicines are reviewed at length with the patient today.  Concerns regarding medicines are outlined above.   Tests Ordered: No tests ordered   Medication Changes: No changes made   Disposition: Follow-up in 12 months   Signed, Julien Nordmann, MD  10/02/2018 4:14 PM    [facility] [address], [place], [place] [address]

## 2018-10-03 ENCOUNTER — Telehealth (INDEPENDENT_AMBULATORY_CARE_PROVIDER_SITE_OTHER): Payer: BLUE CROSS/BLUE SHIELD | Admitting: Cardiovascular Disease

## 2018-10-03 ENCOUNTER — Other Ambulatory Visit: Payer: Self-pay

## 2018-10-03 DIAGNOSIS — R0602 Shortness of breath: Secondary | ICD-10-CM

## 2018-10-03 DIAGNOSIS — I471 Supraventricular tachycardia: Secondary | ICD-10-CM | POA: Diagnosis not present

## 2018-10-03 DIAGNOSIS — R0789 Other chest pain: Secondary | ICD-10-CM

## 2018-10-03 DIAGNOSIS — I10 Essential (primary) hypertension: Secondary | ICD-10-CM

## 2018-10-03 DIAGNOSIS — R079 Chest pain, unspecified: Secondary | ICD-10-CM

## 2018-10-03 MED ORDER — DILTIAZEM HCL 30 MG PO TABS: 30.0000 mg | ORAL_TABLET | Freq: Three times a day (TID) | ORAL | 3 refills | Status: DC | PRN

## 2018-10-03 MED ORDER — DILTIAZEM HCL ER COATED BEADS 180 MG PO CP24: 180.0000 mg | ORAL_CAPSULE | Freq: Every day | ORAL | 3 refills | Status: DC

## 2018-10-03 NOTE — Patient Instructions (Addendum)
Medication Instructions:  Your physician has recommended you make the following change in your medication:  1. INCREASE Diltiazem up to 180 mg daily 2. AS NEEDED take Diltiazem 30 mg three times daily as needed for fast heart rates or tachycardia  If you need a refill on your cardiac medications before your next appointment, please call your pharmacy.    Lab work: No new labs needed   If you have labs (blood work) drawn today and your tests are completely normal, you will receive your results only by: Marland Kitchen MyChart Message (if you have MyChart) OR . A paper copy in the mail If you have any lab test that is abnormal or we need to change your treatment, we will call you to review the results.   Testing/Procedures: No new testing needed   Follow-Up: At [facility], you and your health needs are our priority.  As part of our continuing mission to provide you with exceptional heart care, we have created designated Provider Care Teams.  These Care Teams include your primary Cardiologist (physician) and Advanced Practice Providers (APPs -  Physician Assistants and Nurse Practitioners) who all work together to provide you with the care you need, when you need it.  . You will need a follow up appointment in 12 months .   Please call our office 2 months in advance to schedule this appointment.    . Providers on your designated Care Team:   . Nicolasa Ducking, NP . Eula Listen, PA-C . Marisue Ivan, PA-C  Any Other Special Instructions Will Be Listed Below (If Applicable).  For educational health videos Log in to : [email or web address] Or : [email or web address], password : [email or web address]

## 2018-10-27 ENCOUNTER — Telehealth: Payer: Self-pay | Admitting: Cardiovascular Disease

## 2018-10-27 NOTE — Telephone Encounter (Signed)
Patient requesting a letter to give to employer to waive mask wearing requirement.  He gets cp when wearing and subsides when removed for a few minutes.   Pt c/o of Chest Pain: STAT if CP now or developed within 24 hours  1. Are you having CP right now? Unknown spoke with wife   2. Are you experiencing any other symptoms (ex. SOB, nausea, vomiting, sweating)? No   3. How long have you been experiencing CP? Started with wearing mask at work   4. Is your CP continuous or coming and going? Comes and goes   5. Have you taken Nitroglycerin? no ?

## 2018-10-30 NOTE — Telephone Encounter (Signed)
Spoke with the patient's wife per the dpr. She stated that the patient works for [employer] and they are requiring the staff to wear masks. Every time he puts the mask on he get tightness in his chest. When he takes the mask off the tightness gets better. She wanted to know if Dr. Mariah Milling would write a letter stating that he cannot wear a mask.   She has been advised that if he does not wear the mask then he is putting himself and possibly others at risk and that a letter may not be possible unless there is a true medical reason as to why he cannot wear one.   She stated that she will also be calling his PCP.

## 2018-10-31 NOTE — Telephone Encounter (Signed)
I would agree, no clear cardiac reason to not wear a mask Would recommend he wear it more loose fitting so he can breathe out the sides somewhat Even difficult to exclude people with severe COPD or congestive heart failure to not wear a mask

## 2018-11-01 NOTE — Telephone Encounter (Signed)
I spoke with the patient's wife and advised her of Dr. Windell Hummingbird recommendations. I apologized that we could not provide a letter to the patient, but it for his safely and that of others that the mask is recommended during this peak COVID time.  I have advised that he try to obtain a mask with the ties on it to make it more adjustable for him it the ones with the elastic are too tight.   The patient's wife voices understanding of the above and was appreciative for the call back.

## 2018-11-09 ENCOUNTER — Encounter: Payer: Self-pay | Admitting: Family Medicine

## 2018-11-10 MED ORDER — TAMSULOSIN HCL 0.4 MG PO CAPS: 0.4000 mg | ORAL_CAPSULE | Freq: Every day | ORAL | 1 refills | Status: DC

## 2018-11-15 ENCOUNTER — Encounter: Payer: Self-pay | Admitting: Family Medicine

## 2018-11-17 ENCOUNTER — Encounter: Payer: Self-pay | Admitting: Family Medicine

## 2018-11-17 ENCOUNTER — Telehealth (INDEPENDENT_AMBULATORY_CARE_PROVIDER_SITE_OTHER): Payer: BLUE CROSS/BLUE SHIELD | Admitting: Family Medicine

## 2018-11-17 DIAGNOSIS — T148XXA Other injury of unspecified body region, initial encounter: Secondary | ICD-10-CM | POA: Insufficient documentation

## 2018-11-17 MED ORDER — CYCLOBENZAPRINE HCL 10 MG PO TABS: 10.0000 mg | ORAL_TABLET | Freq: Three times a day (TID) | ORAL | 0 refills | Status: DC | PRN

## 2018-11-17 MED ORDER — NAPROXEN 500 MG PO TABS: 500.0000 mg | ORAL_TABLET | Freq: Two times a day (BID) | ORAL | 0 refills | Status: DC

## 2018-11-17 NOTE — Assessment & Plan Note (Signed)
-  Start naproxen 500mg  BID and flexeril 10mg  TID as needed. Discussed that sedation may occur with flexeril, try at night initially and if makes very drowsy to avoid while working/driving -Continue icing -HEP sent via MyChart -Call if not improving over the next few weeks.

## 2018-11-17 NOTE — Progress Notes (Signed)
Chad Harrington - 43 y.o. male MRN [id number]  Date of birth: 12/24/1975   This visit type was conducted due to national recommendations for restrictions regarding the COVID-19 Pandemic (e.g. social distancing).  This format is felt to be most appropriate for this patient at this time.  All issues noted in this document were discussed and addressed.  No physical exam was performed (except for noted visual exam findings with Video Visits).  I discussed the limitations of evaluation and management by telemedicine and the availability of in person appointments. The patient expressed understanding and agreed to proceed.  I connected with@ on 11/17/18 at 10:00 AM EDT by a video enabled telemedicine application and verified that I am speaking with the correct person using two identifiers.   Patient Location: Home [address] [facility] [place] [id number]   Provider location:   Home office  Chief Complaint  Patient presents with  . Muscle Pain    Location L Shoulder blade, lifting & turning at same time, Onset: 4 days,TOC -tylenol/motrin    HPI  Chad Harrington is a 43 y.o. male who presents via audio/video conferencing for a telehealth visit today.  He has complaint of pain in L shoulder blade area.  Pain started about 4 days ago after picking up a 40-50lb box at work.  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  Has tried icing, heat, tylenol and OTC ibuprofen without much relief.  Pain is worse with twisting movement or overhead movement of the arm.  He does sometimes have radiation of pain in the arm.  He denies numbness, tingling or weakness.  He has never had this in the past.     ROS:  A comprehensive ROS was completed and negative except as noted per HPI  Past Medical History:  Diagnosis Date  . Atrial fibrillation (HCC)   . BPH (benign prostatic hyperplasia)   . Mitral valve prolapse   . Prostate  infection 04/2013    Past Surgical History:  Procedure Laterality Date  . KNEE SURGERY      Family History  Problem Relation Age of Onset  . Diabetes Father   . COPD Father   . Hypertension Father   . Heart disease Father [id number]       MI  . Heart attack Father   . Cancer Maternal Grandfather        colon CA    Social History   Socioeconomic History  . Marital status: Married    Spouse name: Not on file  . Number of children: Not on file  . Years of education: Not on file  . Highest education level: Not on file  Occupational History  . Not on file  Social Needs  . Financial resource strain: Not on file  . Food insecurity:    Worry: Not on file    Inability: Not on file  . Transportation needs:    Medical: Not on file    Non-medical: Not on file  Tobacco Use  . Smoking status: Never Smoker  . Smokeless tobacco: Never Used  Substance and Sexual Activity  . Alcohol use: Yes    Alcohol/week: 0.0 standard drinks    Comment: occassionally.  . Drug use: No  . Sexual activity: Not on file  Lifestyle  . Physical activity:    Days per week: Not on file    Minutes per session: Not on file  .  Stress: Not on file  Relationships  . Social connections:    Talks on phone: Not on file    Gets together: Not on file    Attends religious service: Not on file    Active member of club or organization: Not on file    Attends meetings of clubs or organizations: Not on file    Relationship status: Not on file  . Intimate partner violence:    Fear of current or ex partner: Not on file    Emotionally abused: Not on file    Physically abused: Not on file    Forced sexual activity: Not on file  Other Topics Concern  . Not on file  Social History Narrative  . Not on file     Current Outpatient Medications:  .  Diclofenac Sodium (PENNSAID) 2 % SOLN, Apply 1 pump (20 mg) Topically to affected area twice a day, Disp: 112 g, Rfl: 5 .  diltiazem (CARDIZEM CD) 180 MG 24 hr capsule,  Take 1 capsule (180 mg total) by mouth daily., Disp: 90 capsule, Rfl: 3 .  diltiazem (CARDIZEM) 30 MG tablet, Take 1 tablet (30 mg total) by mouth 3 (three) times daily as needed (As needed for fast heart rates or tachycardia)., Disp: 90 tablet, Rfl: 3 .  fluocinonide-emollient (LIDEX-E) 0.05 % cream, Apply 1 application topically 2 (two) times daily., Disp: 30 g, Rfl: 0 .  omeprazole (PRILOSEC) 40 MG capsule, Take 1 capsule (40 mg total) by mouth daily., Disp: 30 capsule, Rfl: 3 .  tamsulosin (FLOMAX) 0.4 MG CAPS capsule, Take 1 capsule (0.4 mg total) by mouth daily., Disp: 90 capsule, Rfl: 1 .  cyclobenzaprine (FLEXERIL) 10 MG tablet, Take 1 tablet (10 mg total) by mouth 3 (three) times daily as needed for muscle spasms., Disp: 30 tablet, Rfl: 0 .  naproxen (NAPROSYN) 500 MG tablet, Take 1 tablet (500 mg total) by mouth 2 (two) times daily with a meal., Disp: 30 tablet, Rfl: 0  EXAM:  VITALS per patient if applicable: BP 121/82 Comment: pt reports from HM  Pulse 88   Temp 98.6 F (37 C) (Oral)   Ht 5\' 11"  (1.803 m)   Wt 223 lb (101.2 kg)   BMI 31.10 kg/m   GENERAL: alert, oriented, appears well and in no acute distress  HEENT: atraumatic, conjunttiva clear, no obvious abnormalities on inspection of external nose and ears  NECK: normal movements of the head and neck  LUNGS: on inspection no signs of respiratory distress, breathing rate appears normal, no obvious gross SOB, gasping or wheezing  CV: no obvious cyanosis  MS: moves all visible extremities without noticeable abnormality  PSYCH/NEURO: pleasant and cooperative, no obvious depression or anxiety, speech and thought processing grossly intact  ASSESSMENT AND PLAN:  Discussed the following assessment and plan:  Muscle strain -Start naproxen 500mg  BID and flexeril 10mg  TID as needed. Discussed that sedation may occur with flexeril, try at night initially and if makes very drowsy to avoid while working/driving -Continue  icing -HEP sent via MyChart -Call if not improving over the next few weeks.        I discussed the assessment and treatment plan with the patient. The patient was provided an opportunity to ask questions and all were answered. The patient agreed with the plan and demonstrated an understanding of the instructions.   The patient was advised to call back or seek an in-person evaluation if the symptoms worsen or if the condition fails to improve as anticipated.  Luetta Nutting, DO

## 2018-11-17 NOTE — Patient Instructions (Signed)
Mid-Back Strain Rehab  Ask your health care provider which exercises are safe for you. Do exercises exactly as told by your health care provider and adjust them as directed. It is normal to feel mild stretching, pulling, tightness, or discomfort as you do these exercises, but you should stop right away if you feel sudden pain or your pain gets worse. Do not begin these exercises until told by your health care provider.  Stretching and range of motion exercises  This exercise warms up your muscles and joints and improves the movement and flexibility of your back and shoulders. This exercise also help to relieve pain.  Exercise A: Chest and spine stretch    1. Lie down on your back on a firm surface.  2. Roll a towel or a small blanket so it is about 4 inches (10 cm) in diameter.  3. Put the towel lengthwise under the middle of your back so it is under your spine, but not under your shoulder blades.  4. To increase the stretch, you may put your hands behind your head and let your elbows fall to your sides.  5. Hold for __________ seconds.  Repeat exercise __________ times. Complete this exercise __________ times a day.  Strengthening exercises  These exercises build strength and endurance in your back and your shoulder blade muscles. Endurance is the ability to use your muscles for a long time, even after they get tired.  Exercise B: Alternating arm and leg raises    1. Get on your hands and knees on a firm surface. If you are on a hard floor, you may want to use padding to cushion your knees, such as an exercise mat.  2. Line up your arms and legs. Your hands should be below your shoulders, and your knees should be below your hips.  3. Lift your left leg behind you. At the same time, raise your right arm and straighten it in front of you.  ? Do not lift your leg higher than your hip.  ? Do not lift your arm higher than your shoulder.  ? Keep your abdominal and back muscles tight.  ? Keep your hips facing the  ground.  ? Do not arch your back.  ? Keep your balance carefully, and do not hold your breath.  4. Hold for __________ seconds.  5. Slowly return to the starting position and repeat with your right leg and your left arm.  Repeat __________ times. Complete this exercise __________ times a day.  Exercise C: Straight arm rows (shoulder extension)    1. Stand with your feet shoulder width apart.  2. Secure an exercise band to a stable object in front of you so the band is at or above shoulder height.  3. Hold one end of the exercise band in each hand.  4. Straighten your elbows and lift your hands up to shoulder height.  5. Step back, away from the secured end of the exercise band, until the band stretches.  6. Squeeze your shoulder blades together and pull your hands down to the sides of your thighs. Stop when your hands are straight down by your sides. Do not let your hands go behind your body.  7. Hold for __________ seconds.  8. Slowly return to the starting position.  Repeat __________ times. Complete this exercise __________ times a day.  Exercise D: Shoulder external rotation, prone  1. Lie on your abdomen on a firm bed so your left / right forearm hangs   over the edge of the bed and your upper arm is on the bed, straight out from your body.  ? Your elbow should be bent.  ? Your palm should be facing your feet.  2. If instructed, hold a __________ weight in your hand.  3. Squeeze your shoulder blade toward the middle of your back. Do not let your shoulder lift toward your ear.  4. Keep your elbow bent in an "L" shape (90 degrees) while you slowly move your forearm up toward the ceiling. Move your forearm up to the height of the bed, toward your head.  ? Your upper arm should not move.  ? At the top of the movement, your palm should face the floor.  5. Hold for __________ seconds.  6. Slowly return to the starting position and relax your muscles.  Repeat __________ times. Complete this exercise __________ times a  day.  Exercise E: Scapular retraction and external rotation, rowing    1. Sit in a stable chair without armrests, or stand.  2. Secure an exercise band to a stable object in front of you so it is at shoulder height.  3. Hold one end of the exercise band in each hand.  4. Bring your arms out straight in front of you.  5. Step back, away from the secured end of the exercise band, until the band stretches.  6. Pull the band backward. As you do this, bend your elbows and squeeze your shoulder blades together, but avoid letting the rest of your body move. Do not let your shoulders lift up toward your ears.  7. Stop when your elbows are at your sides or slightly behind your body.  8. Hold for __________ seconds.  9. Slowly straighten your arms to return to the starting position.  Repeat __________ times. Complete this exercise __________ times a day.  Posture and body mechanics    Body mechanics refers to the movements and positions of your body while you do your daily activities. Posture is part of body mechanics. Good posture and healthy body mechanics can help to relieve stress in your body's tissues and joints. Good posture means that your spine is in its natural S-curve position (your spine is neutral), your shoulders are pulled back slightly, and your head is not tipped forward. The following are general guidelines for applying improved posture and body mechanics to your everyday activities.  Standing     When standing, keep your spine neutral and your feet about hip-width apart. Keep a slight bend in your knees. Your ears, shoulders, and hips should line up.   When you do a task in which you lean forward while standing in one place for a long time, place one foot up on a stable object that is 2-4 inches (5-10 cm) high, such as a footstool. This helps keep your spine neutral.  Sitting     When sitting, keep your spine neutral and keep your feet flat on the floor. Use a footrest, if necessary, and keep your thighs  parallel to the floor. Avoid rounding your shoulders, and avoid tilting your head forward.   When working at a desk or a computer, keep your desk at a height where your hands are slightly lower than your elbows. Slide your chair under your desk so you are close enough to maintain good posture.   When working at a computer, place your monitor at a height where you are looking straight ahead and you do not have to tilt   your head forward or downward to look at the screen.  Resting  When lying down and resting, avoid positions that are most painful for you.   If you have pain with activities such as sitting, bending, stooping, or squatting (flexion-based activities), lie in a position in which your body does not bend very much. For example, avoid curling up on your side with your arms and knees near your chest (fetal position).   If you have pain with activities such as standing for a long time or reaching with your arms (extension-based activities), lie with your spine in a neutral position and bend your knees slightly. Try the following positions:   Lying on your side with a pillow between your knees.   Lying on your back with a pillow under your knees.    Lifting     When lifting objects, keep your feet at least shoulder-width apart and tighten your abdominal muscles.   Bend your knees and hips and keep your spine neutral. It is important to lift using the strength of your legs, not your back. Do not lock your knees straight out.   Always ask for help to lift heavy or awkward objects.  This information is not intended to replace advice given to you by your health care provider. Make sure you discuss any questions you have with your health care provider.  Document Released: 06/07/2005 Document Revised: 02/12/2016 Document Reviewed: 03/19/2015  Elsevier Interactive Patient Education  2019 [employer]

## 2019-02-15 ENCOUNTER — Other Ambulatory Visit: Payer: Self-pay

## 2019-02-15 ENCOUNTER — Ambulatory Visit: Payer: Self-pay | Admitting: *Deleted

## 2019-02-15 ENCOUNTER — Emergency Department: Payer: BC Managed Care – PPO

## 2019-02-15 ENCOUNTER — Emergency Department
Admission: EM | Admit: 2019-02-15 | Discharge: 2019-02-15 | Disposition: A | Discharge status: Home / Self Care | Payer: BC Managed Care – PPO | Attending: Student in an Organized Health Care Education/Training Program | Admitting: Student in an Organized Health Care Education/Training Program

## 2019-02-15 DIAGNOSIS — I10 Essential (primary) hypertension: Secondary | ICD-10-CM | POA: Insufficient documentation

## 2019-02-15 DIAGNOSIS — B9789 Other viral agents as the cause of diseases classified elsewhere: Secondary | ICD-10-CM | POA: Diagnosis not present

## 2019-02-15 DIAGNOSIS — Z20828 Contact with and (suspected) exposure to other viral communicable diseases: Secondary | ICD-10-CM | POA: Diagnosis not present

## 2019-02-15 DIAGNOSIS — J069 Acute upper respiratory infection, unspecified: Secondary | ICD-10-CM | POA: Insufficient documentation

## 2019-02-15 DIAGNOSIS — J029 Acute pharyngitis, unspecified: Secondary | ICD-10-CM | POA: Diagnosis present

## 2019-02-15 DIAGNOSIS — Z79899 Other long term (current) drug therapy: Secondary | ICD-10-CM | POA: Diagnosis not present

## 2019-02-15 LAB — SARS CORONAVIRUS 2 (TAT 6-24 HRS): SARS Coronavirus 2: NEGATIVE

## 2019-02-15 NOTE — Telephone Encounter (Signed)
I returned his call. I feel sore in my neck, back.   I'm short of breath, and my arms and legs feel heavy.   My chest feels tight and I have a headache.    See chest pain protocol.  Started with difficulty breathing protocol but switched to the chest pain protocol as I got more information from him.  Pt referred to the ED.   He asked me to check and see if he could come into the office.   I called into the office and Dr Dayton MartesAron is working remotely today plus if he needs an EKG or cardiac work up it's better to go to the ED. I let him know and so he was agreeable to going.    [facility].  Wife taking him.  Notes forwarded to Dr. Elmer SowAron's office.   Reason for Disposition . [1] Chest pain (or "angina") comes and goes AND [2] is happening more often (increasing in frequency) or getting worse (increasing in severity)  Additional Information . Commented on: [1] MILD difficulty breathing (e.g., minimal/no SOB at rest, SOB with walking, pulse <100) AND [2] NEW-onset or WORSE than normal    Also c/o chest tightness  Answer Assessment - Initial Assessment Questions 1. RESPIRATORY STATUS: "Describe your breathing?" (e.g., wheezing, shortne of breath, unable to speak, severe coughing)      I have chest pain and difficulty breathing.   No fever.   I came home from work.  I take a pill for a fast heart rate. 2. ONSET: "When did this breathing problem begin?"      2-3 days ago.   3. PATTERN "Does the difficult breathing come and go, or has it been constant since it started?"      The shortness of breath is occurring more often.    4. SEVERITY: "How bad is your breathing?" (e.g., mild, moderate, severe)    - MILD: No SOB at rest, mild SOB with walking, speaks normally in sentences, can lay down, no retractions, pulse < 100.    - MODERATE: SOB at rest, SOB with minimal exertion and prefers to sit, cannot lie down flat, speaks in phrases, mild retractions, audible wheezing, pulse  100-120.    - SEVERE: Very SOB at rest, speaks in single words, struggling to breathe, sitting hunched forward, retractions, pulse > 120      Both now 5. RECURRENT SYMPTOM: "Have you had difficulty breathing before?" If so, ask: "When was the last time?" and "What happened that time?"      No 6. CARDIAC HISTORY: "Do you have any history of heart disease?" (e.g., heart attack, angina, bypass surgery, angioplasty)      Yes MVP and a fast heart rate that I take a pill for it. 7. LUNG HISTORY: "Do you have any history of lung disease?"  (e.g., pulmonary embolus, asthma, emphysema)     None 8. CAUSE: "What do you think is causing the breathing problem?"      I don't know.   I thought it was the mask I wear at work but it does not make a difference now when I take the mask off. 9. OTHER SYMPTOMS: "Do you have any other symptoms? (e.g., dizziness, runny nose, cough, chest pain, fever)     I was dizzy 10 minutes ago and I have a headache. 10. PREGNANCY: "Is there any chance you are pregnant?" "When was your last menstrual period?"       N/A 11. TRAVEL: "  Have you traveled out of the country in the last month?" (e.g., travel history, exposures)       No  Answer Assessment - Initial Assessment Questions 1. LOCATION: "Where does it hurt?"       I'm having tightness in the middle of my chest and shortness of breath that has gotten worse over the last 2-3 days. 2. RADIATION: "Does the pain go anywhere else?" (e.g., into neck, jaw, arms, back)     No 3. ONSET: "When did the chest pain begin?" (Minutes, hours or days)     This morning.    4. PATTERN "Does the pain come and go, or has it been constant since it started?"  "Does it get worse with exertion?"      Constant tightness 5. DURATION: "How long does it last" (e.g., seconds, minutes, hours)     Since this morning 6. SEVERITY: "How bad is the pain?"  (e.g., Scale 1-10; mild, moderate, or severe)    - MILD (1-3): doesn't interfere with normal  activities     - MODERATE (4-7): interferes with normal activities or awakens from sleep    - SEVERE (8-10): excruciating pain, unable to do any normal activities       Mild 7. CARDIAC RISK FACTORS: "Do you have any history of heart problems or risk factors for heart disease?" (e.g., angina, prior heart attack; diabetes, high blood pressure, high cholesterol, smoker, or strong family history of heart disease)     I take a pill for a fast heart rate.   Also have mitral valve prolapse, no surgery for it. 8. PULMONARY RISK FACTORS: "Do you have any history of lung disease?"  (e.g., blood clots in lung, asthma, emphysema, birth control pills)     No 9. CAUSE: "What do you think is causing the chest pain?"     I thought the shortness of breath was coming from the mask I have to wear at work.   At first taking the mask off helped but now it doesn't make any difference when I take the mask off, I still feel tight in my chest and short of breath even at rest now.    I came home from work this morning because of it.   I was dizzy about 10 minutes ago when I first got home.   I have a headache and pain in my neck and back.   My legs and arms also feel heavy.    10. OTHER SYMPTOMS: "Do you have any other symptoms?" (e.g., dizziness, nausea, vomiting, sweating, fever, difficulty breathing, cough)       Dizziness, shortness of breath 11. PREGNANCY: "Is there any chance you are pregnant?" "When was your last menstrual period?"       N/A  Protocols used: CHEST PAIN-A-AH, BREATHING DIFFICULTY-A-AH

## 2019-02-15 NOTE — ED Provider Notes (Signed)
[facility] Emergency Department Provider Note   ____________________________________________   First MD Initiated Contact with Patient 02/15/19 1021     (approximate)  I have reviewed the triage vital signs and the nursing notes.   HISTORY  Chief Complaint URI    HPI Chad Harrington is a 43 y.o. male patient complain sore throat, shortness of breath, cough, and body aches for a few days.  Patient state associated at work tested positive for COVID 1910 days ago.  Patient rates his pain as a 4/10.  Patient's current pain as "achy".  No palliative measure for complaint.         Past Medical History:  Diagnosis Date  . Atrial fibrillation (HCC)   . BPH (benign prostatic hyperplasia)   . Mitral valve prolapse   . Prostate infection 04/2013    Patient Active Problem List   Diagnosis Date Noted  . Muscle strain 11/17/2018  . Rash 12/19/2017  . Erectile dysfunction 12/28/2016  . Knee pain 10/25/2016  . Paroxysmal tachycardia (HCC) 02/18/2015  . Joint swelling, finger, hand 08/27/2014  . Hand pain, left 08/27/2014  . 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 Essential hypertension 04/29/2014  . Polyuria 05/14/2013  . Numbness and tingling of right arm 02/24/2013  . Chest discomfort 02/24/2013  . History of mitral valve prolapse 02/24/2013    Past Surgical History:  Procedure Laterality Date  . KNEE SURGERY      Prior to Admission medications   Medication Sig Start Date End Date Taking? Authorizing Provider  cyclobenzaprine (FLEXERIL) 10 MG tablet Take 1 tablet (10 mg total) by mouth 3 (three) times daily as needed for muscle spasms. 11/17/18   Everrett CoombeMatthews, Cody, DO  Diclofenac Sodium (PENNSAID) 2 % SOLN Apply 1 pump (20 mg) Topically to affected area twice a day 09/19/18   Dianne DunAron, Talia M, MD  diltiazem (CARDIZEM CD) 180 MG 24 hr capsule Take 1 capsule (180 mg total) by mouth daily. 10/03/18 01/01/19  Antonieta IbaGollan, Timothy J, MD  diltiazem (CARDIZEM)  30 MG tablet Take 1 tablet (30 mg total) by mouth 3 (three) times daily as needed (As needed for fast heart rates or tachycardia). 10/03/18   Antonieta IbaGollan, Timothy J, MD  fluocinonide-emollient (LIDEX-E) 0.05 % cream Apply 1 application topically 2 (two) times daily. 12/19/17   Dianne DunAron, Talia M, MD  naproxen (NAPROSYN) 500 MG tablet Take 1 tablet (500 mg total) by mouth 2 (two) times daily with a meal. 11/17/18   Everrett CoombeMatthews, Cody, DO  omeprazole (PRILOSEC) 40 MG capsule Take 1 capsule (40 mg total) by mouth daily. 07/04/14   Dianne DunAron, Talia M, MD  tamsulosin (FLOMAX) 0.4 MG CAPS capsule Take 1 capsule (0.4 mg total) by mouth daily. 11/10/18   Dianne DunAron, Talia M, MD    Allergies Patient has no known allergies.  Family History  Problem Relation Age of Onset  . Diabetes Father   . COPD Father   . Hypertension Father   . Heart disease Father [id number]       MI  . Heart attack Father   . Cancer Maternal Grandfather        colon CA    Social History Social History   Tobacco Use  . Smoking status: Never Smoker  . Smokeless tobacco: Never Used  Substance Use Topics  . Alcohol use: Yes    Alcohol/week: 0.0 standard drinks    Comment: occassionally.  . Drug use: No    Review of Systems Constitutional: No fever/chills Eyes: No  visual changes. ENT: No sore throat. Cardiovascular: Denies chest pain. Respiratory: Denies shortness of breath. Gastrointestinal: No abdominal pain.  No nausea, no vomiting.  No diarrhea.  No constipation. Genitourinary: Negative for dysuria. Musculoskeletal: Negative for back pain. Skin: Negative for rash. Neurological: Negative for headaches, focal weakness or numbness.   ____________________________________________   PHYSICAL EXAM:  VITAL SIGNS: ED Triage Vitals  Enc Vitals Group     BP 02/15/19 1016 136/89     Pulse Rate 02/15/19 1016 78     Resp 02/15/19 1016 18     Temp 02/15/19 1016 98.8 F (37.1 C)     Temp Source 02/15/19 1016 Oral     SpO2 02/15/19 1016 98 %      Weight 02/15/19 1014 240 lb (108.9 kg)     Height 02/15/19 1014 5\' 11"  (1.803 m)     Head Circumference --      Peak Flow --      Pain Score 02/15/19 1014 4     Pain Loc --      Pain Edu? --      Excl. in [place]? --    Constitutional: Alert and oriented. Well appearing and in no acute distress. Nose: No congestion/rhinnorhea. Mouth/Throat: Mucous membranes are moist.  Oropharynx non-erythematous. Neck: No stridor.  Hematological/Lymphatic/Immunilogical: No cervical lymphadenopathy. Cardiovascular: Normal rate, regular rhythm. Grossly normal heart sounds.  Good peripheral circulation. Respiratory: Normal respiratory effort.  No retractions. Lungs CTAB. Skin:  Skin is warm, dry and intact. No rash noted. Psychiatric: Mood and affect are normal. Speech and behavior are normal.  ____________________________________________   LABS (all labs ordered are listed, but only abnormal results are displayed)  Labs Reviewed  SARS CORONAVIRUS 2 (TAT 6-12 HRS)  GROUP A STREP BY PCR   ____________________________________________  EKG   ____________________________________________  RADIOLOGY  ED MD interpretation:    Official radiology report(s): Dg Chest Portable 1 View  Result Date: 02/15/2019 CLINICAL DATA:  Shortness of breath EXAM: PORTABLE CHEST 1 VIEW COMPARISON:  08/25/2016 FINDINGS: The heart size and mediastinal contours are within normal limits. Both lungs are clear. The visualized skeletal structures are unremarkable. IMPRESSION: No acute cardiopulmonary findings. Electronically Signed   By: Davina Poke M.D.   On: 02/15/2019 10:43    ____________________________________________   PROCEDURES  Procedure(s) performed (including Critical Care):  Procedures   ____________________________________________   INITIAL IMPRESSION / ASSESSMENT AND PLAN / ED COURSE  As part of my medical decision making, I reviewed the following data within the [place]          Patient presents with 2 days of sore throat, shortness of breath, and body aches.  Patient state past exposure COVID-19 10 days ago.  Physical exam consistent with viral illness.  Patient given discharge care instruction advised of quarantine pending results of covert 19 test.     ____________________________________________   FINAL CLINICAL IMPRESSION(S) / ED DIAGNOSES  Final diagnoses:  Viral URI with cough     ED Discharge Orders    None       Note:  This document was prepared using Dragon voice recognition software and may include unintentional dictation errors.    Sable Feil, PA-C 02/15/19 1120    Merlyn Lot, MD 02/15/19 [phone]

## 2019-02-15 NOTE — Discharge Instructions (Signed)
Advised self quarantine pending results of COVID-19 test. °

## 2019-02-15 NOTE — Telephone Encounter (Signed)
FYI Dr. Deborra Medina

## 2019-02-15 NOTE — ED Notes (Signed)
Pt denies any sore throat and refuses strep test, provider aware

## 2019-02-15 NOTE — ED Triage Notes (Signed)
Pt c/o sob, sore throat, body aches for the past 2 days, states there was an associate at work that tested positive about 10 days ago. Pt is in NAD, respirations WNL

## 2019-02-15 NOTE — ED Notes (Addendum)
C/o body aches xfew days

## 2019-02-18 ENCOUNTER — Encounter: Payer: Self-pay | Admitting: Family Medicine

## 2019-03-12 ENCOUNTER — Ambulatory Visit: Payer: BC Managed Care – PPO | Admitting: Family Medicine

## 2019-03-19 ENCOUNTER — Other Ambulatory Visit: Payer: Self-pay

## 2019-03-19 ENCOUNTER — Ambulatory Visit: Payer: BC Managed Care – PPO | Admitting: Family Medicine

## 2019-03-19 ENCOUNTER — Encounter: Payer: Self-pay | Admitting: Family Medicine

## 2019-03-19 VITALS — BP 114/78 | HR 91 | Temp 98.2°F | Ht 71.0 in | Wt 230.5 lb

## 2019-03-19 DIAGNOSIS — M25561 Pain in right knee: Secondary | ICD-10-CM | POA: Diagnosis not present

## 2019-03-19 DIAGNOSIS — M5416 Radiculopathy, lumbar region: Secondary | ICD-10-CM

## 2019-03-19 MED ORDER — PREDNISONE 20 MG PO TABS: ORAL_TABLET | ORAL | 0 refills | Status: DC

## 2019-03-19 NOTE — Progress Notes (Signed)
Chad Chojnowski T. Oniel Meleski, MD Primary Care and Sports Medicine [facility] [address] [place], [id number] Phone: [phone]  FAX: [phone]  Chad HaringLeonard W Harrington - 43 y.o. male  MRN [id number]  Date of Birth: May 30, 1976  Visit Date: 03/19/2019  PCP: Dianne DunAron, Talia M, MD  Referred by: Dianne DunAron, Talia M, MD  Chief Complaint  Patient presents with  . Knee Pain    Bilateral since August   Subjective:   Chad Harrington is a 43 y.o. very pleasant male patient with Body mass index is 32.15 kg/m. who presents with the following:  He is a pleasant guy and I remember him quite well.  Primary issue now is left-sided radicular pain and this goes all the way down the posterior aspect of his knee to his foot.  He is not had any change in his sensation, numbness, or strength.  He does have pain, this is limiting him somewhat.  He is not having any bowel or bladder incontinence and no saddle anesthesia.  Right knee is also modestly painful when he rises from a seated position and goes up and down stairs.  This is primarily behind the kneecap.  Pain L radicular pain.  Lower bak with rads. No change in sensation or weight.   R knee   Past Medical History, Surgical History, Social History, Family History, Problem List, Medications, and Allergies have been reviewed and updated if relevant.  Patient Active Problem List   Diagnosis Date Noted  . Muscle strain 11/17/2018  . Rash 12/19/2017  . Erectile dysfunction 12/28/2016  . Knee pain 10/25/2016  . Paroxysmal tachycardia (HCC) 02/18/2015  . Joint swelling, finger, hand 08/27/2014  . Hand pain, left 08/27/2014  . 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 Essential hypertension 04/29/2014  . Polyuria 05/14/2013  . Numbness and tingling of right arm 02/24/2013  . Chest discomfort 02/24/2013  . History of mitral valve prolapse 02/24/2013    Past Medical History:  Diagnosis Date  . Atrial  fibrillation (HCC)   . BPH (benign prostatic hyperplasia)   . Mitral valve prolapse   . Prostate infection 04/2013    Past Surgical History:  Procedure Laterality Date  . KNEE SURGERY      Social History   Socioeconomic History  . Marital status: Married    Spouse name: Not on file  . Number of children: Not on file  . Years of education: Not on file  . Highest education level: Not on file  Occupational History  . Not on file  Social Needs  . Financial resource strain: Not on file  . Food insecurity    Worry: Not on file    Inability: Not on file  . Transportation needs    Medical: Not on file    Non-medical: Not on file  Tobacco Use  . Smoking status: Never Smoker  . Smokeless tobacco: Never Used  Substance and Sexual Activity  . Alcohol use: Yes    Alcohol/week: 0.0 standard drinks    Comment: occassionally.  . Drug use: No  . Sexual activity: Not on file  Lifestyle  . Physical activity    Days per week: Not on file    Minutes per session: Not on file  . Stress: Not on file  Relationships  . Social Musicianconnections    Talks on phone: Not on file    Gets together: Not on file    Attends religious service: Not on file  Active member of club or organization: Not on file    Attends meetings of clubs or organizations: Not on file    Relationship status: Not on file  . Intimate partner violence    Fear of current or ex partner: Not on file    Emotionally abused: Not on file    Physically abused: Not on file    Forced sexual activity: Not on file  Other Topics Concern  . Not on file  Social History Narrative  . Not on file    Family History  Problem Relation Age of Onset  . Diabetes Father   . COPD Father   . Hypertension Father   . Heart disease Father 107       MI  . Heart attack Father   . Cancer Maternal Grandfather        colon CA    No Known Allergies  Medication list reviewed and updated in full in [facility].  GEN: No fevers, chills.  Nontoxic. Primarily MSK c/o today. MSK: Detailed in the HPI GI: tolerating PO intake without difficulty Neuro: as above Otherwise the pertinent positives of the ROS are noted above.   Objective:   BP 114/78   Pulse 91   Temp 98.2 F (36.8 C) (Temporal)   Ht 5\' 11"  (1.803 m)   Wt 230 lb 8 oz (104.6 kg)   SpO2 95%   BMI 32.15 kg/m    GEN: Well-developed,well-nourished,in no acute distress; alert,appropriate and cooperative throughout examination HEENT: Normocephalic and atraumatic without obvious abnormalities. Ears, externally no deformities PULM: Breathing comfortably in no respiratory distress EXT: No clubbing, cyanosis, or edema PSYCH: Normally interactive. Cooperative during the interview. Pleasant. Friendly and conversant. Not anxious or depressed appearing. Normal, full affect.  Range of motion at  the waist: Flexion: normal Extension: normal Lateral bending: normal Rotation: all normal  No echymosis or edema Rises to examination table with no difficulty Gait: non antalgic  Inspection/Deformity: N Paraspinus Tenderness: L4-s1  B Ankle Dorsiflexion (L5,4): 5/5 B Great Toe Dorsiflexion (L5,4): 5/5 Heel Walk (L5): WNL Toe Walk (S1): WNL Rise/Squat (L4): WNL  SENSORY B Medial Foot (L4): WNL B Dorsum (L5): WNL B Lateral (S1): WNL Light Touch: WNL Pinprick: WNL  REFLEXES Knee (L4): 2+ Ankle (S1): 2+  B SLR, seated: neg B SLR, supine: neg B FABER: neg B Reverse FABER: neg B Greater Troch: NT B Log Roll: neg B Stork: NT B Sciatic Notch: ttp  Right knee has full motion, all ligaments are stable.  Some modest tenderness behind the kneecap with loading the medial lateral patella facets.  McMurray's and flexion pinch are completely normal.  Radiology: No results found.  Assessment and Plan:     ICD-10-CM   1. Left lumbar radiculopathy  M54.16   2. Acute pain of right knee  M25.561    Lumbar radiculopathy is the primary issue.  I am in a pulse with  steroids and give him some core rehab to do.  Right knee is a minimal consequence, and I believe this is all patellofemoral.  Follow-up: No follow-ups on file.  Meds ordered this encounter  Medications  . predniSONE (DELTASONE) 20 MG tablet    Sig: 2 tabs po for 7 days, then 1 tab po for 7 days    Dispense:  21 tablet    Refill:  0   No orders of the defined types were placed in this encounter.   Signed,  . Erinne Gillentine, MD   Outpatient  Encounter Medications as of 03/19/2019  Medication Sig  . Diclofenac Sodium (PENNSAID) 2 % SOLN Apply 1 pump (20 mg) Topically to affected area twice a day  . diltiazem (CARDIZEM CD) 180 MG 24 hr capsule Take 1 capsule (180 mg total) by mouth daily.  Marland Kitchen omeprazole (PRILOSEC) 40 MG capsule Take 1 capsule (40 mg total) by mouth daily.  . tamsulosin (FLOMAX) 0.4 MG CAPS capsule Take 1 capsule (0.4 mg total) by mouth daily.  . predniSONE (DELTASONE) 20 MG tablet 2 tabs po for 7 days, then 1 tab po for 7 days  . [DISCONTINUED] cyclobenzaprine (FLEXERIL) 10 MG tablet Take 1 tablet (10 mg total) by mouth 3 (three) times daily as needed for muscle spasms.  . [DISCONTINUED] diltiazem (CARDIZEM) 30 MG tablet Take 1 tablet (30 mg total) by mouth 3 (three) times daily as needed (As needed for fast heart rates or tachycardia).  . [DISCONTINUED] fluocinonide-emollient (LIDEX-E) 0.05 % cream Apply 1 application topically 2 (two) times daily.  . [DISCONTINUED] naproxen (NAPROSYN) 500 MG tablet Take 1 tablet (500 mg total) by mouth 2 (two) times daily with a meal.   No facility-administered encounter medications on file as of 03/19/2019.

## 2019-12-11 ENCOUNTER — Telehealth: Payer: Self-pay | Admitting: General Practice

## 2019-12-11 NOTE — Telephone Encounter (Signed)
Removed Dr. Dayton Martes as patient's PCP. Per patient's wife, the family will be transferring back to [place] at [facility].

## 2020-08-11 ENCOUNTER — Ambulatory Visit: Payer: BC Managed Care – PPO | Admitting: Nurse Practitioner

## 2020-08-11 ENCOUNTER — Other Ambulatory Visit: Payer: Self-pay

## 2020-08-11 ENCOUNTER — Encounter: Payer: Self-pay | Admitting: Nurse Practitioner

## 2020-08-11 VITALS — BP 120/78 | HR 66 | Ht 71.0 in | Wt 219.0 lb

## 2020-08-11 DIAGNOSIS — R079 Chest pain, unspecified: Secondary | ICD-10-CM

## 2020-08-11 DIAGNOSIS — I10 Essential (primary) hypertension: Secondary | ICD-10-CM | POA: Diagnosis not present

## 2020-08-11 DIAGNOSIS — I471 Supraventricular tachycardia, unspecified: Secondary | ICD-10-CM

## 2020-08-11 MED ORDER — DILTIAZEM HCL ER COATED BEADS 180 MG PO CP24: 180.0000 mg | ORAL_CAPSULE | Freq: Every day | ORAL | 3 refills | Status: DC

## 2020-08-11 NOTE — Progress Notes (Signed)
Office Visit    Patient Name: Chad Harrington Date of Encounter: 08/11/2020  Primary Care Provider:  Patient, No Pcp Per Primary Cardiologist:  Julien Nordmann, MD  Chief Complaint    45 y/o ? w/ a h/o chronic chest pain, HTN, FH of premature CAD, MVP, tachypalpitations/PSVT, and h/o prostatitis, who presents for f/u of chest pain.  Past Medical History    Past Medical History:  Diagnosis Date  . Chronic Chest pain    a. 02/2015 ETT: Ex time 9:16. No ECG changes - nl study.  . Essential hypertension   . History of Prostatitis 04/2013  . Mitral valve prolapse   . Tachycardia    Past Surgical History:  Procedure Laterality Date  . KNEE SURGERY      Allergies  No Known Allergies  History of Present Illness    45 year old male with above past medical history including chronic chest pain dating back to age 23, essential hypertension tachypalpitations/PSVT, reported history of mitral valve prolapse, and prostatitis.  He previously underwent exercise treadmill testing in 2016 showing good exercise tolerance without ST or T changes on ECG.  He was last seen via telemedicine visit in 2020, at which time symptoms were stable.  Palpitations and hypertension have been well managed with diltiazem therapy.  He notes that over the past nearly 2 years, he has not been having any significant palpitations.  His blood pressure is well controlled today at 120/78.  He continues to experience random episodes of chest discomfort once every few months or so, often occurring at rest, sometimes associated with dyspnea, lasting just a few minutes, and resolving spontaneously.  He does not typically experience exertional symptoms and denies exertional dyspnea.  He and his wife are trying to work on [employer] and both have lost some weight.  He is down 21 pounds since August 2020.  He denies palpitations, PND, orthopnea, dizziness, syncope, edema, or early satiety.  Home Medications    Prior to  Admission medications   Medication Sig Start Date End Date Taking? Authorizing Provider  Diclofenac Sodium (PENNSAID) 2 % SOLN Apply 1 pump (20 mg) Topically to affected area twice a day 09/19/18  Yes Dianne Dun, MD  diltiazem (CARDIZEM CD) 180 MG 24 hr capsule Take 1 capsule (180 mg total) by mouth daily. 10/03/18  Yes Antonieta Iba, MD  omeprazole (PRILOSEC) 40 MG capsule Take 1 capsule (40 mg total) by mouth daily. 07/04/14  Yes Dianne Dun, MD    Review of Systems    Still experiences episodic chest discomfort about once every few months, typically at rest.  Chest pain seems to be more likely associated with dyspnea over the past few years.  He does not have exertional symptoms.  He denies palpitations, PND, orthopnea, dizziness, syncope, edema, or early satiety..  All other systems reviewed and are otherwise negative except as noted above.  Physical Exam    VS:  BP 120/78 (BP Location: Left Arm, Patient Position: Sitting, Cuff Size: Normal)   Pulse 66   Ht 5\' 11"  (1.803 m)   Wt 219 lb (99.3 kg)   SpO2 98%   BMI 30.54 kg/m  , BMI Body mass index is 30.54 kg/m. GEN: Well nourished, well developed, in no acute distress. HEENT: normal. Neck: Supple, no JVD, carotid bruits, or masses. Cardiac: RRR, no murmurs, rubs, or gallops. No clubbing, cyanosis, edema.  Radials/PT 2+ and equal bilaterally.  Respiratory:  Respirations regular and unlabored, clear to auscultation  bilaterally. GI: Soft, nontender, nondistended, BS + x 4. MS: no deformity or atrophy. Skin: warm and dry, no rash. Neuro:  Strength and sensation are intact. Psych: Normal affect.  Accessory Clinical Findings    ECG personally reviewed by me today -regular sinus rhythm, 66, left axis deviation- no acute changes.  Lab Results  Component Value Date   WBC 11.1 (H) 12/17/2016   HGB 15.6 12/17/2016   HCT 46.4 12/17/2016   MCV 90.3 12/17/2016   PLT 176 12/17/2016   Lab Results  Component Value Date    CREATININE 1.13 12/17/2016   BUN 10 12/17/2016   NA 134 (L) 12/17/2016   K 4.1 12/17/2016   CL 104 12/17/2016   CO2 21 (L) 12/17/2016   Lab Results  Component Value Date   ALT 64 (H) 12/17/2016   AST 66 (H) 12/17/2016   ALKPHOS 86 12/17/2016   BILITOT 0.9 12/17/2016   Lab Results  Component Value Date   CHOL 171 02/25/2015   HDL 38.30 (L) 02/25/2015   LDLCALC 113 (H) 02/25/2015   TRIG 97.0 02/25/2015   CHOLHDL 4 02/25/2015    Assessment & Plan    1.  Chronic chest pain/family history of premature CAD: Patient with a history of chest pain dating back to age 45.  He previously underwent exercise treadmill testing in 2016 which was normal and showed good exercise tolerance.  He continues to experience chest pain about once every few months, typically at rest, lasting a few minutes, and resolving spontaneously.  Symptoms are more likely to be associated with dyspnea than they had been in the past.  In light of patient's family history of premature CAD, I did offer to arrange for coronary CT angiography however he wished to defer.  I also offered follow-up lipids, which he declined  2.  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  Continue diltiazem therapy.  Refill today.  3.  Essential hypertension: Stable on diltiazem.  Continue current dose.  4.  Disposition: I offered to follow-up routine annual labs as patient has not had anything since 2018 and has not had lipids since 2016.  Patient declined.  I recommended primary care follow-up.  Otherwise, he will follow up here in 1 year.   Nicolasa Ducking, NP 08/11/2020, 5:01 PM

## 2020-08-11 NOTE — Patient Instructions (Signed)
Medication Instructions:  No changes  *If you need a refill on your cardiac medications before your next appointment, please call your pharmacy*   Lab Work: None  If you have labs (blood work) drawn today and your tests are completely normal, you will receive your results only by: . MyChart Message (if you have MyChart) OR . A paper copy in the mail If you have any lab test that is abnormal or we need to change your treatment, we will call you to review the results.   Testing/Procedures: None    Follow-Up: At [facility], you and your health needs are our priority.  As part of our continuing mission to provide you with exceptional heart care, we have created designated Provider Care Teams.  These Care Teams include your primary Cardiologist (physician) and Advanced Practice Providers (APPs -  Physician Assistants and Nurse Practitioners) who all work together to provide you with the care you need, when you need it.   Your next appointment:   1 year(s)  The format for your next appointment:   In Person  Provider:   Timothy Gollan, MD  

## 2020-09-14 ENCOUNTER — Other Ambulatory Visit: Payer: Self-pay

## 2020-09-14 ENCOUNTER — Ambulatory Visit (INDEPENDENT_AMBULATORY_CARE_PROVIDER_SITE_OTHER): Payer: BC Managed Care – PPO

## 2020-09-14 ENCOUNTER — Encounter (HOSPITAL_COMMUNITY): Payer: Self-pay | Admitting: *Deleted

## 2020-09-14 ENCOUNTER — Ambulatory Visit (HOSPITAL_COMMUNITY)
Admission: EM | Admit: 2020-09-14 | Discharge: 2020-09-14 | Disposition: A | Discharge status: Home / Self Care | Payer: BC Managed Care – PPO | Attending: Student | Admitting: Student

## 2020-09-14 DIAGNOSIS — R059 Cough, unspecified: Secondary | ICD-10-CM | POA: Diagnosis not present

## 2020-09-14 DIAGNOSIS — J069 Acute upper respiratory infection, unspecified: Secondary | ICD-10-CM

## 2020-09-14 DIAGNOSIS — J129 Viral pneumonia, unspecified: Secondary | ICD-10-CM

## 2020-09-14 DIAGNOSIS — R0602 Shortness of breath: Secondary | ICD-10-CM

## 2020-09-14 MED ORDER — ALBUTEROL SULFATE HFA 108 (90 BASE) MCG/ACT IN AERS: 1.0000 | INHALATION_SPRAY | Freq: Four times a day (QID) | RESPIRATORY_TRACT | 0 refills | Status: DC | PRN

## 2020-09-14 MED ORDER — AZITHROMYCIN 250 MG PO TABS: 250.0000 mg | ORAL_TABLET | Freq: Every day | ORAL | 0 refills | Status: DC

## 2020-09-14 NOTE — Discharge Instructions (Addendum)
-  Start the antibiotic-Z-Pak (azithromycin), take 2 pills on day 1.  Take 1 pill/day on days 2 through 5. -Use your albuterol inhaler as needed for wheezing, cough, shortness of breath. -Come back and see [place] if you develop new symptoms like worsening shortness of breath despite treatment, new fever/chills, chest pain at rest, dizziness, etc.

## 2020-09-14 NOTE — ED Provider Notes (Signed)
[facility]    CSN: [id number] Arrival date & time: 09/14/20  1347      History   Chief Complaint Chief Complaint  Patient presents with  . Cough  . Wheezing    HPI Chad Harrington is a 45 y.o. male presenting with 5 days of cough, wheezing, nasal congestion.  History hypertension, chronic chest pain, tachycardia, mitral valve prolapse.  States he was seen by [facility] this morning and was told to present to urgent care for further evaluation.  Endorses productive cough, wheezing, dyspnea on exertion.  Nasal congestion.  Denies history of lung disease, but has required albuterol inhaler in the past. Denies fevers/chills, n/v/d, chest pain,  facial pain, teeth pain, headaches, sore throat, loss of taste/smell, swollen lymph nodes, ear pain.    HPI  Past Medical History:  Diagnosis Date  . Chronic Chest pain    a. 02/2015 ETT: Ex time 9:16. No ECG changes - nl study.  . Essential hypertension   . History of Prostatitis 04/2013  . Mitral valve prolapse   . Tachycardia     Patient Active Problem List   Diagnosis Date Noted  . Muscle strain 11/17/2018  . Rash 12/19/2017  . Erectile dysfunction 12/28/2016  . Knee pain 10/25/2016  . Paroxysmal tachycardia (HCC) 02/18/2015  . Joint swelling, finger, hand 08/27/2014  . Hand pain, left 08/27/2014  . 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 Essential hypertension 04/29/2014  . Polyuria 05/14/2013  . Numbness and tingling of right arm 02/24/2013  . Chest discomfort 02/24/2013  . History of mitral valve prolapse 02/24/2013    Past Surgical History:  Procedure Laterality Date  . KNEE SURGERY         Home Medications    Prior to Admission medications   Medication Sig Start Date End Date Taking? Authorizing Provider  albuterol (VENTOLIN HFA) 108 (90 Base) MCG/ACT inhaler Inhale 1-2 puffs into the lungs every 6 (six) hours as needed for wheezing or shortness of breath. 09/14/20  Yes Rhys Martini, PA-C   azithromycin (ZITHROMAX Z-PAK) 250 MG tablet Take 1 tablet (250 mg total) by mouth daily. z-pack: Take 2 pills on day 1.  Take 1 pill/day on days 2 through 5. 09/14/20  Yes Rhys Martini, PA-C  diltiazem (CARDIZEM CD) 180 MG 24 hr capsule Take 1 capsule (180 mg total) by mouth daily. 08/11/20  Yes Creig Hines, NP  Diclofenac Sodium (PENNSAID) 2 % SOLN Apply 1 pump (20 mg) Topically to affected area twice a day 09/19/18   Dianne Dun, MD  omeprazole (PRILOSEC) 40 MG capsule Take 1 capsule (40 mg total) by mouth daily. 07/04/14   Dianne Dun, MD    Family History Family History  Problem Relation Age of Onset  . Diabetes Father   . COPD Father   . Hypertension Father   . Heart disease Father 54       MI  . Heart attack Father   . Cancer Maternal Grandfather        colon CA    Social History Social History   Tobacco Use  . Smoking status: Never Smoker  . Smokeless tobacco: Never Used  Vaping Use  . Vaping Use: Never used  Substance Use Topics  . Alcohol use: Yes    Alcohol/week: 0.0 standard drinks    Comment: occasionally  . Drug use: No     Allergies   Patient has no known allergies.   Review of Systems Review of  Systems  Constitutional: Negative for appetite change, chills and fever.  HENT: Positive for congestion. Negative for ear pain, rhinorrhea, sinus pressure, sinus pain and sore throat.   Eyes: Negative for redness and visual disturbance.  Respiratory: Positive for cough, shortness of breath and wheezing. Negative for chest tightness.   Cardiovascular: Negative for chest pain and palpitations.  Gastrointestinal: Negative for abdominal pain, constipation, diarrhea, nausea and vomiting.  Genitourinary: Negative for dysuria, frequency and urgency.  Musculoskeletal: Negative for myalgias.  Neurological: Negative for dizziness, weakness and headaches.  Psychiatric/Behavioral: Negative for confusion.  All other systems reviewed and are  negative.    Physical Exam Triage Vital Signs ED Triage Vitals  Enc Vitals Group     BP 09/14/20 1400 (!) 141/84     Pulse Rate 09/14/20 1400 94     Resp 09/14/20 1400 (!) 22     Temp 09/14/20 1400 98.3 F (36.8 C)     Temp Source 09/14/20 1400 Oral     SpO2 09/14/20 1400 91 %     Weight --      Height --      Head Circumference --      Peak Flow --      Pain Score 09/14/20 1359 3     Pain Loc --      Pain Edu? --      Excl. in GC? --    No data found.  Updated Vital Signs BP (!) 141/84   Pulse 94   Temp 98.3 F (36.8 C) (Oral)   Resp (!) 22   SpO2 91%   Visual Acuity Right Eye Distance:   Left Eye Distance:   Bilateral Distance:    Right Eye Near:   Left Eye Near:    Bilateral Near:     Physical Exam Vitals reviewed.  Constitutional:      General: He is not in acute distress.    Appearance: Normal appearance. He is not ill-appearing.  HENT:     Head: Normocephalic and atraumatic.     Right Ear: Hearing, tympanic membrane, ear canal and external ear normal. No swelling or tenderness. There is no impacted cerumen. No mastoid tenderness. Tympanic membrane is not perforated, erythematous, retracted or bulging.     Left Ear: Hearing, tympanic membrane, ear canal and external ear normal. No swelling or tenderness. There is no impacted cerumen. No mastoid tenderness. Tympanic membrane is not perforated, erythematous, retracted or bulging.     Nose:     Right Sinus: No maxillary sinus tenderness or frontal sinus tenderness.     Left Sinus: No maxillary sinus tenderness or frontal sinus tenderness.     Mouth/Throat:     Mouth: Mucous membranes are moist.     Pharynx: Uvula midline. No oropharyngeal exudate or posterior oropharyngeal erythema.     Tonsils: No tonsillar exudate.  Eyes:     Extraocular Movements: Extraocular movements intact.     Pupils: Pupils are equal, round, and reactive to light.  Cardiovascular:     Rate and Rhythm: Normal rate and regular  rhythm.     Heart sounds: Normal heart sounds.  Pulmonary:     Effort: Tachypnea present.     Breath sounds: Normal air entry. Wheezing present. No decreased breath sounds, rhonchi or rales.     Comments: Scattered wheezes throughout Chest:     Chest wall: No tenderness.  Abdominal:     General: Abdomen is flat. Bowel sounds are normal.     Tenderness: There  is no abdominal tenderness. There is no guarding or rebound.  Lymphadenopathy:     Cervical: No cervical adenopathy.  Neurological:     General: No focal deficit present.     Mental Status: He is alert and oriented to person, place, and time.  Psychiatric:        Attention and Perception: Attention and perception normal.        Mood and Affect: Mood and affect normal.        Behavior: Behavior normal. Behavior is cooperative.        Thought Content: Thought content normal.        Judgment: Judgment normal.      UC Treatments / Results  Labs (all labs ordered are listed, but only abnormal results are displayed) Labs Reviewed - No data to display  EKG   Radiology DG Chest 2 View  Result Date: 09/14/2020 CLINICAL DATA:  Shortness of breath, cough EXAM: CHEST - 2 VIEW COMPARISON:  02/15/2019 FINDINGS: The heart size and mediastinal contours are within normal limits. Mild diffuse interstitial prominence with streaky right basilar opacity. No pleural effusion. No pneumothorax. The visualized skeletal structures are unremarkable. IMPRESSION: Mild diffuse interstitial prominence with streaky right basilar opacity, suspicious for atypical/viral infection. Electronically Signed   By: Duanne Guess D.O.   On: 09/14/2020 14:29    Procedures Procedures (including critical care time)  Medications Ordered in UC Medications - No data to display  Initial Impression / Assessment and Plan / UC Course  I have reviewed the triage vital signs and the nursing notes.  Pertinent labs & imaging results that were available during my care  of the patient were reviewed by me and considered in my medical decision making (see chart for details).  Clinical Course as of 09/14/20 1505  Sun Sep 14, 2020  1449 DG Chest 2 View [LG]    Clinical Course User Index [LG] Samuella Cota    This patient is a 45 year old male presenting with viral pneumonia.  Today he is afebrile but tachypneic, pulse ox 91%.  Scattered wheezes throughout.  No history of cardiopulmonary disease.  Appears comfortable.  Xray chest- Mild diffuse interstitial prominence with streaky right basilar opacity, suspicious for atypical/viral infection. Films interpreted by myself and radiologist.  Plan to treat with Z-Pak as below.  Albuterol for symptomatic relief.  Suspect covid pneumonia. Declines covid test.  Has completed mandatory 5 days of isolation already, so advised to wear a mask on public for the next 5 days as per CDC guidelines.  Return precautions discussed.  This chart was dictated using voice recognition software, Dragon. Despite the best efforts of this provider to proofread and correct errors, errors may still occur which can change documentation meaning.  Final Clinical Impressions(s) / UC Diagnoses   Final diagnoses:  Pneumonia, viral  Viral URI with cough     Discharge Instructions     -Start the antibiotic-Z-Pak (azithromycin), take 2 pills on day 1.  Take 1 pill/day on days 2 through 5. -Use your albuterol inhaler as needed for wheezing, cough, shortness of breath. -Come back and see [place] if you develop new symptoms like worsening shortness of breath despite treatment, new fever/chills, chest pain at rest, dizziness, etc.    ED Prescriptions    Medication Sig Dispense Auth. Provider   azithromycin (ZITHROMAX Z-PAK) 250 MG tablet Take 1 tablet (250 mg total) by mouth daily. z-pack: Take 2 pills on day 1.  Take 1 pill/day on days 2  through 5. 6 tablet Rhys MartiniGraham, Natiya Seelinger E, PA-C   albuterol (VENTOLIN HFA) 108 (90 Base) MCG/ACT  inhaler Inhale 1-2 puffs into the lungs every 6 (six) hours as needed for wheezing or shortness of breath. 1 each Rhys MartiniGraham, Rody Keadle E, PA-C     PDMP not reviewed this encounter.   Rhys MartiniGraham, Anaika Santillano E, PA-C 09/14/20 [phone]

## 2020-09-14 NOTE — ED Triage Notes (Addendum)
C/O cough and wheezing onset 5 days ago with nasal congestion.  States had video visit through [employer] - was told likely bronchitis or pneumonia so to go to [facility].  Denies fevers.  Declines covid test.

## 2021-11-24 ENCOUNTER — Other Ambulatory Visit: Payer: Self-pay | Admitting: Internal Medicine

## 2021-11-24 DIAGNOSIS — K429 Umbilical hernia without obstruction or gangrene: Secondary | ICD-10-CM

## 2021-11-25 ENCOUNTER — Other Ambulatory Visit: Payer: Self-pay | Admitting: Internal Medicine

## 2021-11-25 ENCOUNTER — Ambulatory Visit
Admission: RE | Admit: 2021-11-25 | Discharge: 2021-11-25 | Disposition: A | Discharge status: Home / Self Care | Payer: BC Managed Care – PPO | Source: Ambulatory Visit | Attending: Internal Medicine | Admitting: Internal Medicine

## 2021-11-25 DIAGNOSIS — K429 Umbilical hernia without obstruction or gangrene: Secondary | ICD-10-CM | POA: Insufficient documentation

## 2022-02-28 NOTE — Progress Notes (Unsigned)
Date:  03/01/2022   ID:  Chad Harrington, DOB 08-08-1975, MRN [id number]  Patient Location:  [address] [facility] [place] [address]   Provider location:   [facility], [place] office  PCP:  Patient, No Pcp Per  Cardiologist:  Fonnie Mu  Chief Complaint  Patient presents with   12 month follow up     "Doing well." Medications reviewed by the patient verbally.     History of Present Illness:    Chad Harrington is a 46 y.o. male  past medical history of chronic chest pain dating back to age 46,  hypertension,  Father,  3 girls all teenagers who presents for routine follow-up for his chest pain,Essential hypertension  Last seen by myself on telemetry visit April 2020 Seen by one of our providers February 2022 In follow-up today he reports having no significant chest pain Off diltiazem, ran out of insurance Changed jobs, now has insurance again  Rare breakthrough of his tachypalpitations  Non-smoker  No diabetes, lab work reviewed on his phone, done through work, [employer]  EKG personally reviewed by myself on todays visit Normal sinus rhythm rate 68 bpm no significant ST-T wave changes  Family history father passed away from CHF, MI, COPD Brother also with sleep apnea, COPD Both his brother and father were smokers   Other past medical history Previously he has had numerous trips to the emergency room, workup in the past. He has been treated with various medications including atenolol, Toprol with no improvement of his symptoms.  visits to the emergency room did not yield a reason for his chest pain. Felt to have GERD. Never tried proton pump inhibitors. Told to try H2 blockers such as Zantac. He has tried NSAIDs before with no relief of his symptoms.    Prior treadmill stress test showing no ischemia on EKG   Reports that he was diagnosed with mitral valve prolapse at age 37.    Also had episodes of sweating, tachycardia,  weakness and dizziness at the end of 2013. No recent episodes     family history, brother had CAD in his late 73s. He is a smoker father also has history of CAD, stroke, CHF in his 41s also a smoker    Past Medical History:  Diagnosis Date   Chronic Chest pain    a. 02/2015 ETT: Ex time 9:16. No ECG changes - nl study.   Essential hypertension    History of Prostatitis 04/2013   Mitral valve prolapse    Tachycardia    Past Surgical History:  Procedure Laterality Date   KNEE SURGERY       Current Meds  Medication Sig   omeprazole (PRILOSEC) 40 MG capsule Take 1 capsule (40 mg total) by mouth daily.     Allergies:   Patient has no known allergies.   Social History   Tobacco Use   Smoking status: Never   Smokeless tobacco: Never  Vaping Use   Vaping Use: Never used  Substance Use Topics   Alcohol use: Yes    Alcohol/week: 0.0 standard drinks of alcohol    Comment: occasionally   Drug use: No     Current Outpatient Medications on File Prior to Visit  Medication Sig Dispense Refill   omeprazole (PRILOSEC) 40 MG capsule Take 1 capsule (40 mg total) by mouth daily. 30 capsule 3   albuterol (VENTOLIN HFA) 108 (90 Base) MCG/ACT inhaler Inhale 1-2 puffs into the lungs  every 6 (six) hours as needed for wheezing or shortness of breath. (Patient not taking: Reported on 03/01/2022) 1 each 0   azithromycin (ZITHROMAX Z-PAK) 250 MG tablet Take 1 tablet (250 mg total) by mouth daily. z-pack: Take 2 pills on day 1.  Take 1 pill/day on days 2 through 5. (Patient not taking: Reported on 03/01/2022) 6 tablet 0   Diclofenac Sodium (PENNSAID) 2 % SOLN Apply 1 pump (20 mg) Topically to affected area twice a day (Patient not taking: Reported on 03/01/2022) 112 g 5   diltiazem (CARDIZEM CD) 180 MG 24 hr capsule Take 1 capsule (180 mg total) by mouth daily. (Patient not taking: Reported on 03/01/2022) 90 capsule 3   No current facility-administered medications on file prior to visit.     Family  Hx: The patient's family history includes COPD in his father; Cancer in his maternal grandfather; Diabetes in his father; Heart attack in his father; Heart disease (age of onset: 87) in his father; Hypertension in his father.  ROS:   Please see the history of present illness.    Review of Systems  Constitutional: Negative.   Respiratory: Negative.    Cardiovascular: Negative.   Gastrointestinal: Negative.   Musculoskeletal: Negative.   Neurological: Negative.   Psychiatric/Behavioral: Negative.    All other systems reviewed and are negative.    Labs/Other Tests and Data Reviewed:    Recent Labs: No results found for requested labs within last 365 days.   Recent Lipid Panel Lab Results  Component Value Date/Time   CHOL 171 02/25/2015 10:44 AM   TRIG 97.0 02/25/2015 10:44 AM   HDL 38.30 (L) 02/25/2015 10:44 AM   CHOLHDL 4 02/25/2015 10:44 AM   LDLCALC 113 (H) 02/25/2015 10:44 AM    Wt Readings from Last 3 Encounters:  03/01/22 237 lb 4 oz (107.6 kg)  08/11/20 219 lb (99.3 kg)  03/19/19 230 lb 8 oz (104.6 kg)     Exam:    Vital Signs: Vital signs may also be detailed in the HPI BP (!) 140/100 (BP Location: Left Arm, Patient Position: Sitting, Cuff Size: Normal) Comment: 5 min BP  Pulse 68   Ht 5\' 11"  (1.803 m)   Wt 237 lb 4 oz (107.6 kg)   SpO2 98%   BMI 33.09 kg/m     Constitutional:  oriented to person, place, and time. No distress.  HENT:  Head: Grossly normal Eyes:  no discharge. No scleral icterus.  Neck: No JVD, no carotid bruits  Cardiovascular: Regular rate and rhythm, no murmurs appreciated Pulmonary/Chest: Clear to auscultation bilaterally, no wheezes or rails Abdominal: Soft.  no distension.  no tenderness.  Musculoskeletal: Normal range of motion Neurological:  normal muscle tone. Coordination normal. No atrophy Skin: Skin warm and dry Psychiatric: normal affect, pleasant   ASSESSMENT & PLAN:    Essential hypertension - Plan: EKG  12-Lead Blood pressure elevated on today's visit, has not been on his diltiazem New prescription provided for diltiazem ER 180 daily Recommend close monitoring of blood pressure at home   Chest discomfort No significant chest pain Continue diltiazem   Shortness of breath Denies shortness of breath No further cardiac work-up needed at this time   BPH  Previously on Flomax through primary care  Paroxysmal supraventricular tachycardia (HCC) Minimal breakthrough tachycardia Restart diltiazem ER 180 daily    Total encounter time more than 30 minutes  Greater than 50% was spent in counseling and coordination of care with the patient   Signed,  Ida Rogue, MD  03/01/2022 4:53 PM    [place] Office [address], [place], [place] [address]

## 2022-03-01 ENCOUNTER — Ambulatory Visit: Payer: BC Managed Care – PPO | Attending: Cardiovascular Disease | Admitting: Cardiovascular Disease

## 2022-03-01 ENCOUNTER — Encounter: Payer: Self-pay | Admitting: Cardiovascular Disease

## 2022-03-01 VITALS — BP 140/100 | HR 68 | Ht 71.0 in | Wt 237.2 lb

## 2022-03-01 DIAGNOSIS — R079 Chest pain, unspecified: Secondary | ICD-10-CM | POA: Diagnosis not present

## 2022-03-01 DIAGNOSIS — I10 Essential (primary) hypertension: Secondary | ICD-10-CM

## 2022-03-01 MED ORDER — DILTIAZEM HCL ER COATED BEADS 180 MG PO CP24: 180.0000 mg | ORAL_CAPSULE | Freq: Every day | ORAL | 3 refills | Status: DC

## 2022-03-01 NOTE — Patient Instructions (Addendum)
Medication Instructions:  Please stay on diltiazem ER 180 mg daily -- give [place] a call if your Blood Pressure continues to be elevated.    If you need a refill on your cardiac medications before your next appointment, please call your pharmacy.   Lab work: No new labs needed  Testing/Procedures: No new testing needed  Follow-Up: At [facility], you and your health needs are our priority.  As part of our continuing mission to provide you with exceptional heart care, we have created designated Provider Care Teams.  These Care Teams include your primary Cardiologist (physician) and Advanced Practice Providers (APPs -  Physician Assistants and Nurse Practitioners) who all work together to provide you with the care you need, when you need it.  You will need a follow up appointment in 12 months  Providers on your designated Care Team:   Nicolasa Ducking, NP Eula Listen, PA-C Cadence Fransico Michael, [place]  COVID-19 Vaccine Information can be found at: [email or web address] For questions related to vaccine distribution or appointments, please email [email or web address] or call [phone].

## 2022-07-03 IMAGING — US US ABDOMEN LIMITED
1 series · 14 of 25 positions shown · non-contrast
Comparison: None Available.

CLINICAL DATA: Paraumbilical hernia.

EXAM:
ULTRASOUND ABDOMEN LIMITED. Grayscale ultrasound and color Doppler
ultrasound of the abdomen.

[Series 1: us abdomen limited · 0.09mm/px · 26 acquisitions, 14 frames shown]
[im 1/26]
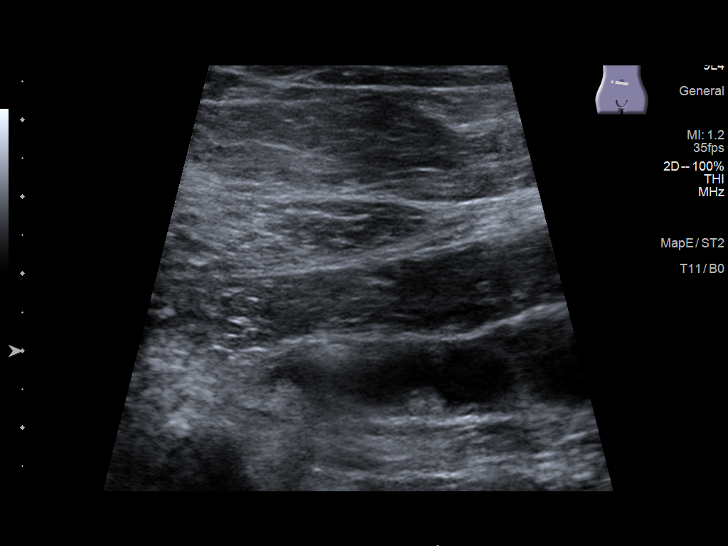
[im 3/26]
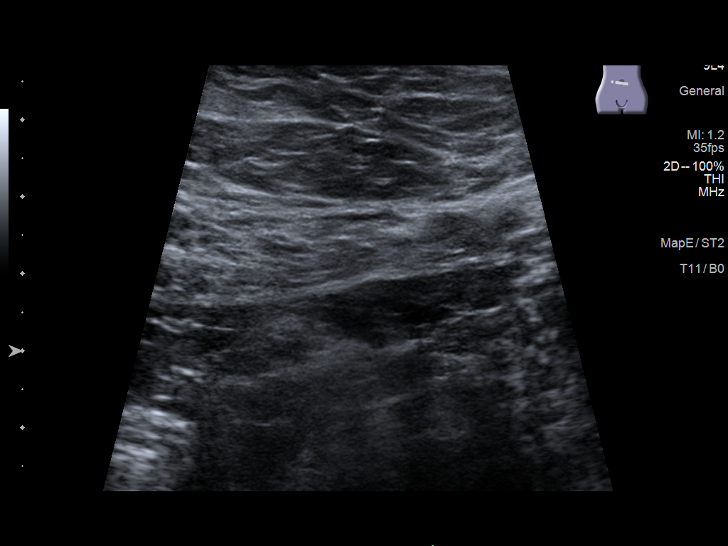
[im 5/26]
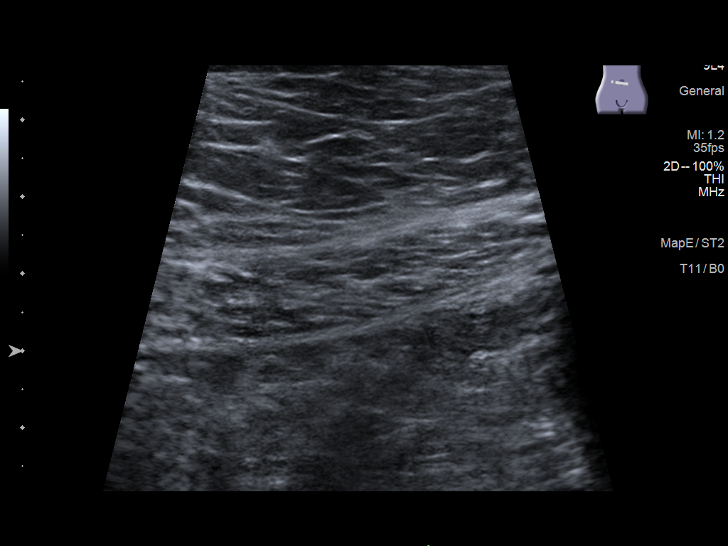
[im 7/26]
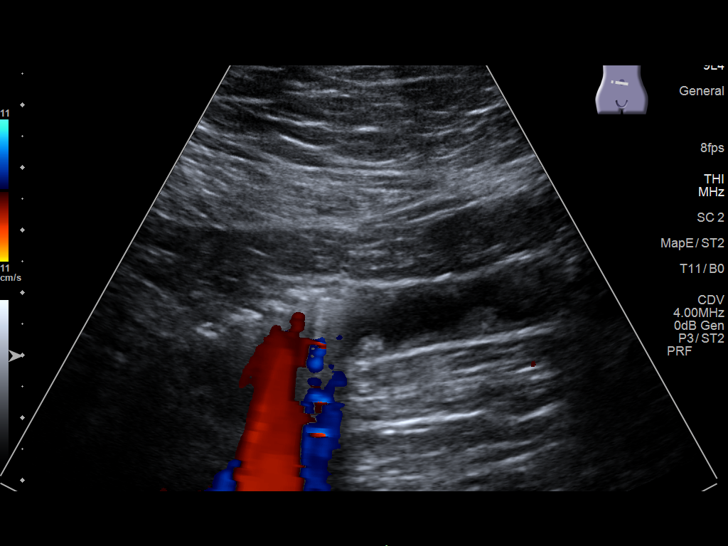
[im 9/26]
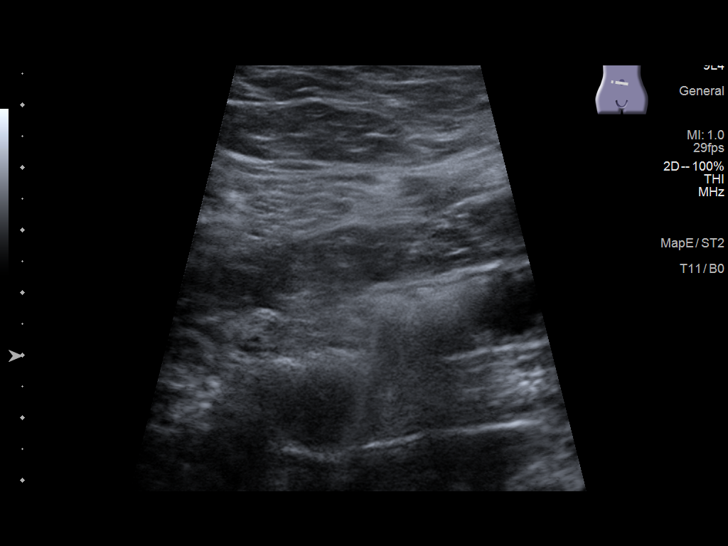
[im 10/26]
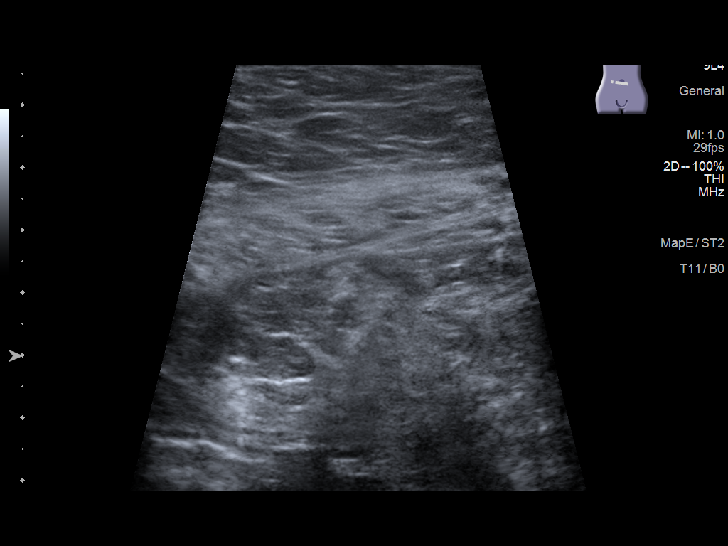
[im 12/26]
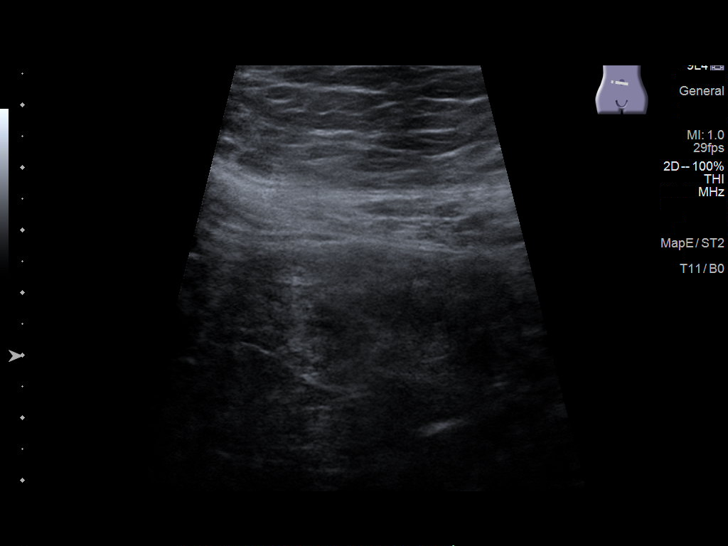
[im 14/26]
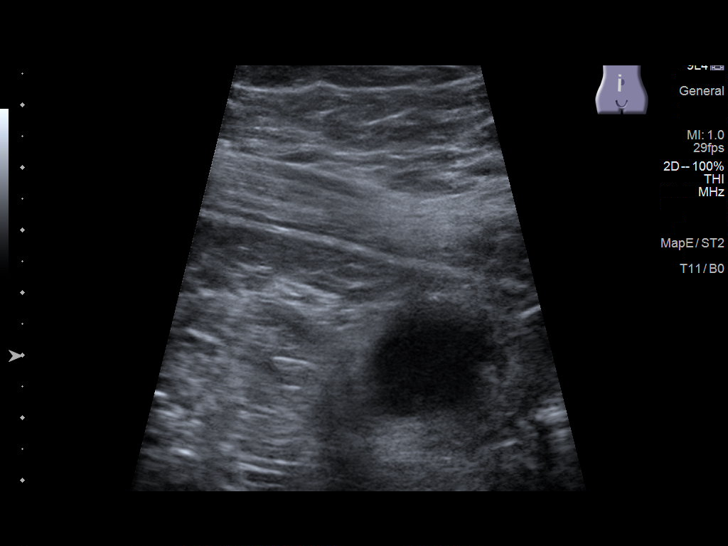
[im 16/26]
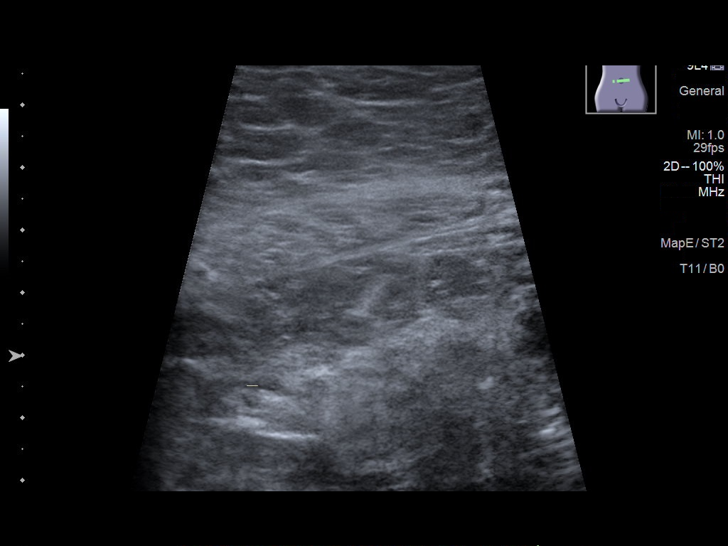
[im 17/26]
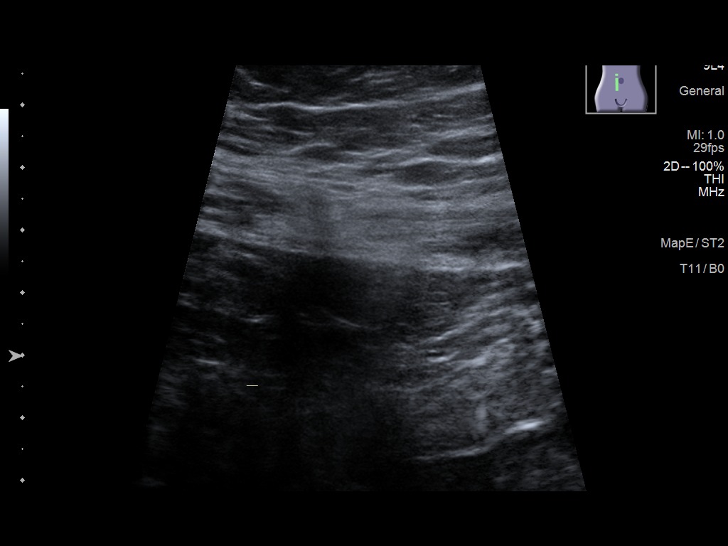
[im 19/26]
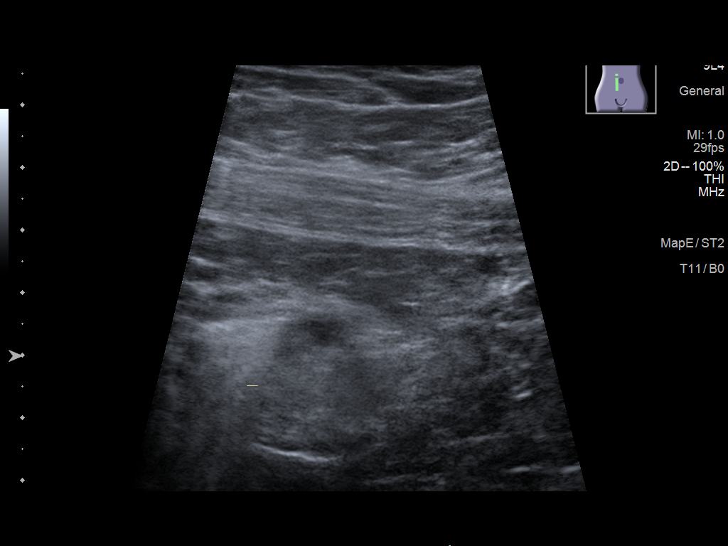
[im 21/26]
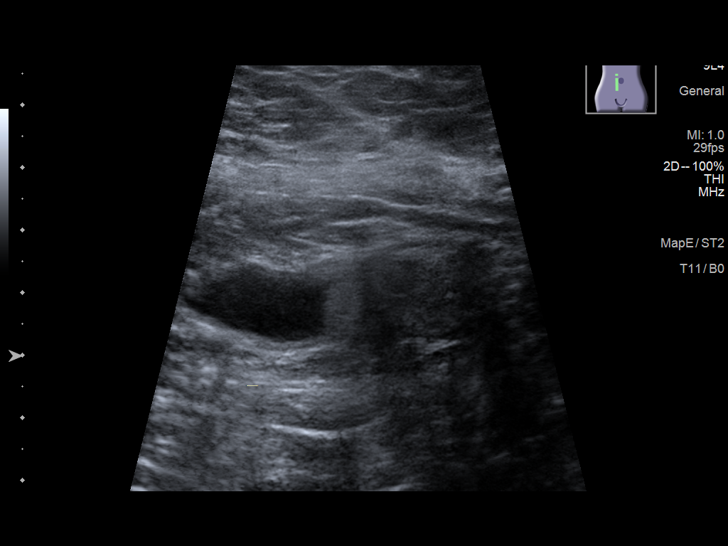
[im 23/26]
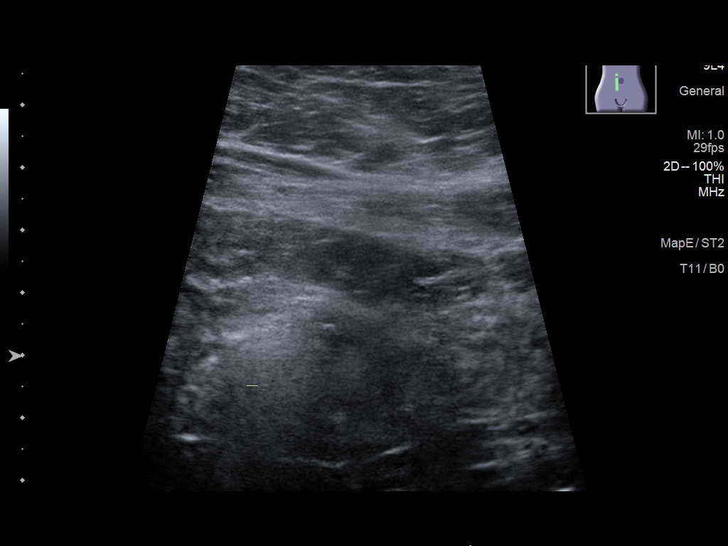
[im 26/26]
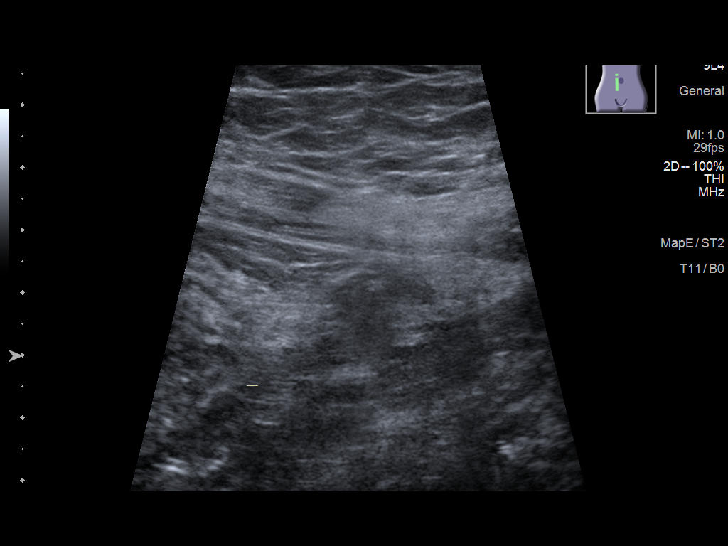

[14 of 25 positions shown; findings below may reference images not displayed]

FINDINGS: No soft tissue mass, cystic lesion, or hernia identified.
IMPRESSION: No soft tissue mass, cystic lesion, or hernia identified.

## 2023-12-07 ENCOUNTER — Ambulatory Visit: Admitting: Family

## 2023-12-07 ENCOUNTER — Encounter: Payer: Self-pay | Admitting: Family

## 2023-12-07 ENCOUNTER — Ambulatory Visit: Payer: Self-pay | Admitting: Family

## 2023-12-07 VITALS — BP 134/86 | HR 70 | Temp 98.1°F | Ht 71.0 in | Wt 234.8 lb

## 2023-12-07 DIAGNOSIS — Z8679 Personal history of other diseases of the circulatory system: Secondary | ICD-10-CM

## 2023-12-07 DIAGNOSIS — I10 Essential (primary) hypertension: Secondary | ICD-10-CM | POA: Diagnosis not present

## 2023-12-07 DIAGNOSIS — R739 Hyperglycemia, unspecified: Secondary | ICD-10-CM

## 2023-12-07 DIAGNOSIS — E538 Deficiency of other specified B group vitamins: Secondary | ICD-10-CM | POA: Diagnosis not present

## 2023-12-07 DIAGNOSIS — E7800 Pure hypercholesterolemia, unspecified: Secondary | ICD-10-CM | POA: Diagnosis not present

## 2023-12-07 DIAGNOSIS — Z0001 Encounter for general adult medical examination with abnormal findings: Secondary | ICD-10-CM | POA: Diagnosis not present

## 2023-12-07 DIAGNOSIS — N50819 Testicular pain, unspecified: Secondary | ICD-10-CM

## 2023-12-07 DIAGNOSIS — Z1211 Encounter for screening for malignant neoplasm of colon: Secondary | ICD-10-CM

## 2023-12-07 DIAGNOSIS — M65342 Trigger finger, left ring finger: Secondary | ICD-10-CM

## 2023-12-07 DIAGNOSIS — I861 Scrotal varices: Secondary | ICD-10-CM | POA: Insufficient documentation

## 2023-12-07 DIAGNOSIS — Z0000 Encounter for general adult medical examination without abnormal findings: Secondary | ICD-10-CM | POA: Insufficient documentation

## 2023-12-07 DIAGNOSIS — N5082 Scrotal pain: Secondary | ICD-10-CM | POA: Insufficient documentation

## 2023-12-07 DIAGNOSIS — M65331 Trigger finger, right middle finger: Secondary | ICD-10-CM | POA: Insufficient documentation

## 2023-12-07 DIAGNOSIS — I479 Paroxysmal tachycardia, unspecified: Secondary | ICD-10-CM

## 2023-12-07 LAB — BASIC METABOLIC PANEL WITH GFR
BUN: 14 mg/dL (ref 6–23)
CO2: 26 meq/L (ref 19–32)
Calcium: 9.3 mg/dL (ref 8.4–10.5)
Chloride: 106 meq/L (ref 96–112)
Creatinine, Ser: 1.09 mg/dL (ref 0.40–1.50)
GFR: 80.78 mL/min (ref >60.00)
Glucose, Bld: 107 mg/dL — ABNORMAL HIGH (ref 70–99)
Potassium: 4.1 meq/L (ref 3.5–5.1)
Sodium: 137 meq/L (ref 135–145)

## 2023-12-07 LAB — VITAMIN B12: Vitamin B-12: 369 pg/mL (ref 211–911)

## 2023-12-07 LAB — CBC
HCT: 45.4 % (ref 39.0–52.0)
Hemoglobin: 15.2 g/dL (ref 13.0–17.0)
MCHC: 33.5 g/dL (ref 30.0–36.0)
MCV: 91.6 fl (ref 78.0–100.0)
Platelets: 298 10*3/uL (ref 150.0–400.0)
RBC: 4.96 Mil/uL (ref 4.22–5.81)
RDW: 13.9 % (ref 11.5–15.5)
WBC: 5.9 10*3/uL (ref 4.0–10.5)

## 2023-12-07 LAB — MICROALBUMIN / CREATININE URINE RATIO
Creatinine,U: 80.9 mg/dL
Microalb Creat Ratio: UNDETERMINED mg/g (ref 0.0–30.0)
Microalb, Ur: <0.7 mg/dL

## 2023-12-07 LAB — LIPID PANEL
Cholesterol: 167 mg/dL (ref 0–200)
HDL: 35.8 mg/dL — ABNORMAL LOW (ref >39.00)
LDL Cholesterol: 111 mg/dL — ABNORMAL HIGH (ref 0–99)
NonHDL: 130.83
Total CHOL/HDL Ratio: 5
Triglycerides: 99 mg/dL (ref 0.0–149.0)
VLDL: 19.8 mg/dL (ref 0.0–40.0)

## 2023-12-07 LAB — HEMOGLOBIN A1C: Hgb A1c MFr Bld: 5.7 % (ref 4.6–6.5)

## 2023-12-07 LAB — PSA: PSA: 0.38 ng/mL (ref 0.10–4.00)

## 2023-12-07 MED ORDER — TAMSULOSIN HCL 0.4 MG PO CAPS: 0.4000 mg | ORAL_CAPSULE | Freq: Every day | ORAL | 3 refills | Status: AC

## 2023-12-07 MED ORDER — DILTIAZEM HCL ER COATED BEADS 180 MG PO CP24: 180.0000 mg | ORAL_CAPSULE | Freq: Every day | ORAL | 3 refills | Status: AC

## 2023-12-07 NOTE — Assessment & Plan Note (Signed)
 Has seen urology in the past. Reviewed note from 2016.  Refill flomax .  If pain persists will obtain CT abd pelvis and consider amitriptyline per urology note from previous.

## 2023-12-07 NOTE — Progress Notes (Signed)
 Subjective:  Patient ID: Chad Harrington, male    DOB: 04-Mar-1976  Age: 48 y.o. MRN: [id number]  Patient Care Team: Felicita Horns, FNP as PCP - General (Family Medicine) Devorah Fonder, MD as PCP - Cardiology (Cardiology) Devorah Fonder, MD as Consulting Physician (Cardiology)   CC:  Chief Complaint  Patient presents with   Establish Care    HPI Chad Harrington is a 48 y.o. male who presents today for an annual physical exam. He reports consuming a general diet. The patient has a physically strenuous job, but has no regular exercise apart from work.  He generally feels well. He reports sleeping well. He does have additional problems to discuss today.   Vision:Not within last year Dental:No regular dental care  Colonoscopy: never has had one, open to a referral  Pt is with acute concerns.   Prior provider was: has not had one in awhile.   Pt is with acute concerns.   Trigger fingers are 'getting worse  Left ring finger and right middle finger.  Has been for > 6 months. Will get 'locked' and he will have to push it out. If he picks up anything heavy it will lock up.   chronic concerns:  Chronic scrotal content pain, has been seeing urology in the past. Has a small left varicocele. Tamsulosin  was used in the past and heplful but he has since ran out as he did not f/u with urology. He does state that when he was on the tamsulosin  pain was relieved. Since he has been off the pain is back feels like someone 'kicked him in the balls'. Last visit was in 2016 with dr. Cherlynn Cornfield urology. If the flomax  had not been helpful the next plan of action would have been a CT abd and pelvis and trial amitriptyline.     Advanced Directives Patient does not have advanced directives   DEPRESSION SCREENING    12/07/2023    7:42 AM 12/19/2017    8:40 AM  PHQ 2/9 Scores  PHQ - 2 Score 0 0  PHQ- 9 Score 0      ROS: Negative unless specifically indicated above in HPI.     Current Outpatient Medications:    tamsulosin  (FLOMAX ) 0.4 MG CAPS capsule, Take 1 capsule (0.4 mg total) by mouth daily., Disp: 90 capsule, Rfl: 3   diltiazem  (CARDIZEM  CD) 180 MG 24 hr capsule, Take 1 capsule (180 mg total) by mouth daily., Disp: 90 capsule, Rfl: 3    Objective:    BP 134/86 (BP Location: Left Arm, Patient Position: Sitting, Cuff Size: Normal)   Pulse 70   Temp 98.1 F (36.7 C) (Temporal)   Ht 5' 11 (1.803 m)   Wt 234 lb 12.8 oz (106.5 kg)   SpO2 98%   BMI 32.75 kg/m   BP Readings from Last 3 Encounters:  12/07/23 134/86  03/01/22 (!) 140/100  09/14/20 (!) 141/84      Physical Exam Vitals reviewed.  Constitutional:      General: He is not in acute distress.    Appearance: Normal appearance. He is normal weight. He is not ill-appearing, toxic-appearing or diaphoretic.  HENT:     Head: Normocephalic.     Right Ear: Tympanic membrane normal.     Left Ear: Tympanic membrane normal.     Nose: Nose normal.     Mouth/Throat:     Mouth: Mucous membranes are moist.   Eyes:     Pupils: Pupils are equal,  round, and reactive to light.    Cardiovascular:     Rate and Rhythm: Normal rate and regular rhythm.  Pulmonary:     Effort: Pulmonary effort is normal.     Breath sounds: Normal breath sounds.   Musculoskeletal:        General: Normal range of motion.     Right hand: Deformity (base of third metacarpal with thickening and tenderness at the tendon sheath) present.     Left hand: Deformity (base of fourth metacarpal with thickening and tenderness at the tendon sheath) present.   Skin:    General: Skin is warm.   Neurological:     General: No focal deficit present.     Mental Status: He is alert and oriented to person, place, and time. Mental status is at baseline.   Psychiatric:        Mood and Affect: Mood normal.        Behavior: Behavior normal.        Thought Content: Thought content normal.        Judgment: Judgment normal.           Assessment & Plan:  Essential hypertension Assessment & Plan: Refill diltiazem  discussed compliance.  Pt advised of the following:  Continue medication as prescribed. Monitor blood pressure periodically and/or when you feel symptomatic. Goal is <130/90 on average. Ensure that you have rested for 30 minutes prior to checking your blood pressure. Record your readings and bring them to your next visit if necessary.work on a low sodium diet.   Orders: -     dilTIAZem  HCl ER Coated Beads; Take 1 capsule (180 mg total) by mouth daily.  Dispense: 90 capsule; Refill: 3 -     Microalbumin / creatinine urine ratio  Paroxysmal tachycardia (HCC) -     dilTIAZem  HCl ER Coated Beads; Take 1 capsule (180 mg total) by mouth daily.  Dispense: 90 capsule; Refill: 3  Varicocele  Scrotal pain -     Tamsulosin  HCl; Take 1 capsule (0.4 mg total) by mouth daily.  Dispense: 90 capsule; Refill: 3 -     PSA  Screening for colon cancer -     Ambulatory referral to Gastroenterology  Trigger finger, left ring finger -     Ambulatory referral to Hand Surgery  Trigger finger, right middle finger Assessment & Plan: Referral to hand surgery  Discussed how to use conservative measures  Resting finger from work that aggravates this, nsaids prn, can wear a splint to keep finger straight and gentle exercises.    Orders: -     Ambulatory referral to Hand Surgery  Encounter for general adult medical examination without abnormal findings Assessment & Plan: Patient Counseling(The following topics were reviewed):  Preventative care handout given to pt  Health maintenance and immunizations reviewed. Please refer to Health maintenance section. Pt advised on safe sex, wearing seatbelts in car, and proper nutrition labwork ordered today for annual Dental health: Discussed importance of regular tooth brushing, flossing, and dental visits.   Orders: -     Lipid panel -     Basic metabolic panel  with GFR -     CBC  Elevated LDL cholesterol level Assessment & Plan: Ordered lipid panel, pending results. Work on low cholesterol diet and exercise as tolerated   Orders: -     Lipid panel  Hyperglycemia -     Hemoglobin A1c  Low serum vitamin B12 -     Vitamin B12  Persistent testicular  pain Assessment & Plan: Has seen urology in the past. Reviewed note from 2016.  Refill flomax .  If pain persists will obtain CT abd pelvis and consider amitriptyline per urology note from previous.    History of mitral valve prolapse Assessment & Plan: Pt overdue for appt with Dr. Gollan, pt to make appt with cardiology.         Follow-up: Return in about 1 year (around 12/06/2024) for f/u CPE.   Felicita Horns, FNP

## 2023-12-07 NOTE — Assessment & Plan Note (Signed)
 Ordered lipid panel, pending results. Work on low cholesterol diet and exercise as tolerated

## 2023-12-07 NOTE — Assessment & Plan Note (Signed)

## 2023-12-07 NOTE — Patient Instructions (Signed)
  A referral was placed today for your fingers.  Please let us  know if you have not heard back within 2 weeks about the referral.  A referral was placed today to [facility].  Please let us  know if you have not heard back within 2 weeks about the referral.

## 2023-12-07 NOTE — Assessment & Plan Note (Signed)
 Refill diltiazem  discussed compliance.  Pt advised of the following:  Continue medication as prescribed. Monitor blood pressure periodically and/or when you feel symptomatic. Goal is <130/90 on average. Ensure that you have rested for 30 minutes prior to checking your blood pressure. Record your readings and bring them to your next visit if necessary.work on a low sodium diet.

## 2023-12-07 NOTE — Assessment & Plan Note (Signed)
 Pt overdue for appt with Dr. Gollan, pt to make appt with cardiology.

## 2023-12-07 NOTE — Assessment & Plan Note (Signed)
 Referral to hand surgery  Discussed how to use conservative measures  Resting finger from work that aggravates this, nsaids prn, can wear a splint to keep finger straight and gentle exercises.

## 2023-12-20 ENCOUNTER — Ambulatory Visit: Admitting: Orthopaedic Surgery

## 2023-12-20 DIAGNOSIS — M65331 Trigger finger, right middle finger: Secondary | ICD-10-CM | POA: Diagnosis not present

## 2023-12-20 DIAGNOSIS — M65342 Trigger finger, left ring finger: Secondary | ICD-10-CM

## 2023-12-20 MED ORDER — METHYLPREDNISOLONE ACETATE 40 MG/ML IJ SUSP
13.3300 mg | INTRAMUSCULAR | Status: AC | PRN
  Administered 2023-12-20: 13.33 mg

## 2023-12-20 MED ORDER — LIDOCAINE HCL 1 % IJ SOLN
0.3000 mL | INTRAMUSCULAR | Status: AC | PRN
  Administered 2023-12-20: .3 mL

## 2023-12-20 MED ORDER — BUPIVACAINE HCL 0.5 % IJ SOLN
0.3300 mL | INTRAMUSCULAR | Status: AC | PRN
  Administered 2023-12-20: .33 mL

## 2023-12-20 NOTE — Progress Notes (Signed)
 Office Visit Note   Patient: Chad Harrington           Date of Birth: September 09, 1975           MRN: [id number] Visit Date: 12/20/2023              Requested by: Corwin Antu, FNP [address] [place],  [place] [address] PCP: Corwin Antu, FNP   Assessment & Plan: Visit Diagnoses:  1. Trigger finger, left ring finger   2. Trigger finger, right middle finger     Plan: History of Present Illness Chad Harrington is a 48 year old male who presents with trigger fingers in the right middle finger and left ring finger.  He has experienced trigger fingers for over a year, affecting his right middle finger and left ring finger. He is right-handed and his job as a [employer] requires the use of both hands. He has not received cortisone injections for the trigger fingers but uses splints at night, which he finds effective. However, he cannot wear them while driving.  Physical Exam MUSCULOSKELETAL: Left hand ring finger locked. Right hand middle finger triggering.  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 Right middle finger triggers, left ring finger locked. Cortisone injections 90-95% effective. - Administered cortisone injection to right middle finger and left ring finger.  I was able to unlock the left ring finger after the cortisone injection with minimal discomfort. - Advised continued use of splints at night.  Follow-Up Instructions: No follow-ups on file.   Orders:  No orders of the defined types were placed in this encounter.  No orders of the defined types were placed in this encounter.     Procedures: Hand/UE Inj: L ring A1 for trigger finger on 12/20/2023 2:03 PM Indications: pain Details: 25 G needle Medications: 0.3 mL lidocaine  1 %; 0.33 mL bupivacaine 0.5 %; 13.33 mg methylPREDNISolone  acetate 40 MG/ML Outcome: tolerated well, no  immediate complications Consent was given by the patient. Patient was prepped and draped in the usual sterile fashion.    Hand/UE Inj: R long A1 for trigger finger on 12/20/2023 2:03 PM Indications: pain Details: 25 G needle Medications: 0.3 mL lidocaine  1 %; 0.33 mL bupivacaine 0.5 %; 13.33 mg methylPREDNISolone  acetate 40 MG/ML Outcome: tolerated well, no immediate complications Consent was given by the patient. Patient was prepped and draped in the usual sterile fashion.       Clinical Data: No additional findings.   Subjective: Chief Complaint  Patient presents with   Right Hand - Pain    Middle finger   Left Hand - Pain    Ring finger    HPI  Review of Systems  Constitutional: Negative.   HENT: Negative.    Eyes: Negative.   Respiratory: Negative.    Cardiovascular: Negative.   Gastrointestinal: Negative.   Endocrine: Negative.   Genitourinary: Negative.   Skin: Negative.   Allergic/Immunologic: Negative.   Neurological: Negative.   Hematological: Negative.   Psychiatric/Behavioral: Negative.    All other systems reviewed and are negative.    Objective: Vital Signs: There were no vitals taken for this visit.  Physical Exam Vitals and nursing note reviewed.  Constitutional:      Appearance: He is well-developed.  HENT:     Head: Normocephalic and atraumatic.   Eyes:     Pupils: Pupils are equal, round, and  reactive to light.   Pulmonary:     Effort: Pulmonary effort is normal.  Abdominal:     Palpations: Abdomen is soft.   Musculoskeletal:        General: Normal range of motion.     Cervical back: Neck supple.   Skin:    General: Skin is warm.   Neurological:     Mental Status: He is alert and oriented to person, place, and time.   Psychiatric:        Behavior: Behavior normal.        Thought Content: Thought content normal.        Judgment: Judgment normal.     Ortho Exam  Specialty Comments:  No specialty comments  available.  Imaging: No results found.   PMFS History: Patient Active Problem List   Diagnosis Date Noted   Varicocele 12/07/2023   Elevated LDL cholesterol level 12/07/2023   Encounter for general adult medical examination without abnormal findings 12/07/2023   Trigger finger, right middle finger 12/07/2023   Trigger finger, left ring finger 12/07/2023   Paroxysmal tachycardia (HCC) 02/18/2015   Persistent testicular pain 07/01/2014   Essential hypertension 04/29/2014   History of mitral valve prolapse 02/24/2013   Past Medical History:  Diagnosis Date   Chronic Chest pain    a. 02/2015 ETT: Ex time 9:16. No ECG changes - nl study.   Essential hypertension    History of Prostatitis 04/2013   Mitral valve prolapse    Tachycardia     Family History  Problem Relation Age of Onset   Dementia Mother    Diabetes Father    COPD Father    Hypertension Father    Heart disease Father 70       MI   Heart attack Father 23   Stroke Maternal Grandmother     Past Surgical History:  Procedure Laterality Date   KNEE SURGERY Left    meniscal repair   Social History   Occupational History   Occupation: [employer]  Tobacco Use   Smoking status: Never   Smokeless tobacco: Never  Vaping Use   Vaping status: Never Used  Substance and Sexual Activity   Alcohol use: Yes    Alcohol/week: 0.0 standard drinks of alcohol    Comment: occasionally   Drug use: No   Sexual activity: Yes    Partners: Female    Birth control/protection: Surgical

## 2024-02-20 NOTE — Progress Notes (Unsigned)
 Date:  02/21/2024   ID:  Chad Harrington, DOB 11-Sep-1975, MRN [id number]  Patient Location:  [address] [facility] [place] [address]   Provider location:   [facility], [place] office  PCP:  Corwin Antu, FNP  Cardiologist:  Perla CRIS Nicolas  Chief Complaint  Patient presents with   12 month follow up     Doing well.     History of Present Illness:    Chad PORCARO is a 48 y.o. male  past medical history of chronic chest pain dating back to age 62,  hypertension,  Father,  3 girls all teenagers who presents for routine follow-up for his chest pain,Essential hypertension  Last seen by myself 9/23 In follow-up today reports feeling well Tolerating diltiazem  ER 180 daily Denies significant chest pain, blood pressure well-controlled Working full-time  Denies significant tachycardia  Non-smoker  No diabetes,   EKG personally reviewed by myself on todays visit EKG Interpretation Date/Time:  Tuesday February 21 2024 16:16:58 EDT Ventricular Rate:  82 PR Interval:  162 QRS Duration:  98 QT Interval:  368 QTC Calculation: 429 R Axis:   -13  Text Interpretation: Normal sinus rhythm When compared with ECG of 08-Aug-2016 12:22, No significant change was found Confirmed by Perla Lye [phone]) on 02/21/2024 5:00:58 PM    Family history father passed away from CHF, MI, COPD Brother also with sleep apnea, COPD Both his brother and father were smokers   Other past medical history Previously he has had numerous trips to the emergency room, workup in the past. He has been treated with various medications including atenolol, Toprol with no improvement of his symptoms.  visits to the emergency room did not yield a reason for his chest pain. Felt to have GERD. Never tried proton pump inhibitors. Told to try H2 blockers such as Zantac. He has tried NSAIDs before with no relief of his symptoms.    Prior treadmill stress test showing  no ischemia on EKG   Reports that he was diagnosed with mitral valve prolapse at age 49.    Also had episodes of sweating, tachycardia, weakness and dizziness at the end of 2013. No recent episodes    Family history  brother had CAD in his late 22s. He is a smoker father also has history of CAD, stroke, CHF in his 3s also a smoker    Past Medical History:  Diagnosis Date   Chronic Chest pain    a. 02/2015 ETT: Ex time 9:16. No ECG changes - nl study.   Essential hypertension    History of Prostatitis 04/2013   Mitral valve prolapse    Tachycardia    Past Surgical History:  Procedure Laterality Date   KNEE SURGERY Left    meniscal repair     Current Meds  Medication Sig   diltiazem  (CARDIZEM  CD) 180 MG 24 hr capsule Take 1 capsule (180 mg total) by mouth daily.   tamsulosin  (FLOMAX ) 0.4 MG CAPS capsule Take 1 capsule (0.4 mg total) by mouth daily.     Allergies:   Patient has no known allergies.   Social History   Tobacco Use   Smoking status: Never   Smokeless tobacco: Never  Vaping Use   Vaping status: Never Used  Substance Use Topics   Alcohol use: Yes    Alcohol/week: 0.0 standard drinks of alcohol    Comment: occasionally   Drug use: No     Current Outpatient Medications on  File Prior to Visit  Medication Sig Dispense Refill   diltiazem  (CARDIZEM  CD) 180 MG 24 hr capsule Take 1 capsule (180 mg total) by mouth daily. 90 capsule 3   tamsulosin  (FLOMAX ) 0.4 MG CAPS capsule Take 1 capsule (0.4 mg total) by mouth daily. 90 capsule 3   No current facility-administered medications on file prior to visit.     Family Hx: The patient's family history includes COPD in his father; Dementia in his mother; Diabetes in his father; Heart attack (age of onset: 74) in his father; Heart disease (age of onset: 2) in his father; Hypertension in his father; Stroke in his maternal grandmother.  ROS:   Please see the history of present illness.    Review of Systems   Constitutional: Negative.   Respiratory: Negative.    Cardiovascular: Negative.   Gastrointestinal: Negative.   Musculoskeletal: Negative.   Neurological: Negative.   Psychiatric/Behavioral: Negative.    All other systems reviewed and are negative.    Labs/Other Tests and Data Reviewed:    Recent Labs: 12/07/2023: BUN 14; Creatinine, Ser 1.09; Hemoglobin 15.2; Platelets 298.0; Potassium 4.1; Sodium 137   Recent Lipid Panel Lab Results  Component Value Date/Time   CHOL 167 12/07/2023 08:16 AM   TRIG 99.0 12/07/2023 08:16 AM   HDL 35.80 (L) 12/07/2023 08:16 AM   CHOLHDL 5 12/07/2023 08:16 AM   LDLCALC 111 (H) 12/07/2023 08:16 AM    Wt Readings from Last 3 Encounters:  02/21/24 235 lb 8 oz (106.8 kg)  12/07/23 234 lb 12.8 oz (106.5 kg)  03/01/22 237 lb 4 oz (107.6 kg)     Exam:    Vital Signs: Vital signs may also be detailed in the HPI BP 120/88 (BP Location: Left Arm, Patient Position: Sitting, Cuff Size: Normal)   Pulse 82   Ht 5' 11 (1.803 m)   Wt 235 lb 8 oz (106.8 kg)   SpO2 98%   BMI 32.85 kg/m    Constitutional:  oriented to person, place, and time. No distress.  HENT:  Head: Grossly normal Eyes:  no discharge. No scleral icterus.  Neck: No JVD, no carotid bruits  Cardiovascular: Regular rate and rhythm, no murmurs appreciated Pulmonary/Chest: Clear to auscultation bilaterally, no wheezes or rails Abdominal: Soft.  no distension.  no tenderness.  Musculoskeletal: Normal range of motion Neurological:  normal muscle tone. Coordination normal. No atrophy Skin: Skin warm and dry Psychiatric: normal affect, pleasant   ASSESSMENT & PLAN:    Essential hypertension - Plan: EKG 12-Lead Blood pressure is well controlled on today's visit. No changes made to the medications.  Chest discomfort Denies significant chest pain, continue current dose diltiazem  ER   Shortness of breath Active, denies significant shortness of breath   BPH  On Flomax  to primary  care  Paroxysmal supraventricular tachycardia (HCC) Minimal breakthrough tachycardia Continue diltiazem  ER 180 daily      Signed, Evalene Lunger, MD  02/21/2024 5:01 PM    [facility] [address], [place], [place] [address]

## 2024-02-21 ENCOUNTER — Encounter: Payer: Self-pay | Admitting: Cardiovascular Disease

## 2024-02-21 ENCOUNTER — Ambulatory Visit: Attending: Cardiovascular Disease | Admitting: Cardiovascular Disease

## 2024-02-21 VITALS — BP 120/88 | HR 82 | Ht 71.0 in | Wt 235.5 lb

## 2024-02-21 DIAGNOSIS — I479 Paroxysmal tachycardia, unspecified: Secondary | ICD-10-CM

## 2024-02-21 DIAGNOSIS — E7800 Pure hypercholesterolemia, unspecified: Secondary | ICD-10-CM

## 2024-02-21 DIAGNOSIS — I10 Essential (primary) hypertension: Secondary | ICD-10-CM | POA: Diagnosis not present

## 2024-02-21 DIAGNOSIS — R079 Chest pain, unspecified: Secondary | ICD-10-CM | POA: Diagnosis not present

## 2024-02-21 NOTE — Patient Instructions (Signed)

## 2024-04-23 ENCOUNTER — Encounter: Payer: Self-pay | Admitting: Radiology
